# Patient Record
Sex: Male | Born: 1975
Health system: Southern US, Community
[De-identification: ages and names within clinical notes are randomized; demographics above are authoritative.]

## PROBLEM LIST (undated history)

## (undated) DIAGNOSIS — K651 Peritoneal abscess: Secondary | ICD-10-CM

## (undated) DIAGNOSIS — K5090 Crohn's disease, unspecified, without complications: Secondary | ICD-10-CM

## (undated) HISTORY — DX: Peritoneal abscess: K65.1

## (undated) HISTORY — DX: Crohn's disease, unspecified, without complications: K50.90

---

## 2013-12-12 ENCOUNTER — Encounter (HOSPITAL_COMMUNITY): Payer: Self-pay | Admitting: General Surgery

## 2013-12-12 ENCOUNTER — Ambulatory Visit
Admission: RE | Admit: 2013-12-12 | Discharge: 2013-12-12 | Disposition: A | Discharge status: Home / Self Care | Payer: BC Managed Care – PPO | Source: Ambulatory Visit | Attending: Family Medicine | Admitting: Family Medicine

## 2013-12-12 ENCOUNTER — Inpatient Hospital Stay (HOSPITAL_COMMUNITY)
Admission: EM | Admit: 2013-12-12 | Discharge: 2013-12-15 | DRG: 386 | Disposition: A | Discharge status: Home / Self Care | Payer: BC Managed Care – PPO | Attending: Internal Medicine | Admitting: Internal Medicine

## 2013-12-12 ENCOUNTER — Other Ambulatory Visit: Payer: Self-pay | Admitting: Family Medicine

## 2013-12-12 DIAGNOSIS — R1031 Right lower quadrant pain: Secondary | ICD-10-CM

## 2013-12-12 DIAGNOSIS — K50014 Crohn's disease of small intestine with abscess: Principal | ICD-10-CM | POA: Diagnosis present

## 2013-12-12 DIAGNOSIS — R109 Unspecified abdominal pain: Secondary | ICD-10-CM

## 2013-12-12 DIAGNOSIS — N1330 Unspecified hydronephrosis: Secondary | ICD-10-CM | POA: Diagnosis present

## 2013-12-12 DIAGNOSIS — L0291 Cutaneous abscess, unspecified: Secondary | ICD-10-CM

## 2013-12-12 DIAGNOSIS — K651 Peritoneal abscess: Secondary | ICD-10-CM | POA: Diagnosis present

## 2013-12-12 DIAGNOSIS — Z87891 Personal history of nicotine dependence: Secondary | ICD-10-CM | POA: Diagnosis not present

## 2013-12-12 LAB — COMPREHENSIVE METABOLIC PANEL
ALBUMIN: 3.4 g/dL — AB (ref 3.5–5.2)
ALT: 15 U/L (ref 0–53)
AST: 19 U/L (ref 0–37)
Alkaline Phosphatase: 69 U/L (ref 39–117)
Anion gap: 12 (ref 5–15)
BUN: 13 mg/dL (ref 6–23)
CALCIUM: 9.4 mg/dL (ref 8.4–10.5)
CO2: 30 mEq/L (ref 19–32)
Chloride: 98 mEq/L (ref 96–112)
Creatinine, Ser: 0.93 mg/dL (ref 0.50–1.35)
GFR calc Af Amer: >90 mL/min (ref >90)
GFR calc non Af Amer: >90 mL/min (ref >90)
Glucose, Bld: 147 mg/dL — ABNORMAL HIGH (ref 70–99)
Potassium: 4.4 mEq/L (ref 3.7–5.3)
Sodium: 140 mEq/L (ref 137–147)
TOTAL PROTEIN: 7.2 g/dL (ref 6.0–8.3)
Total Bilirubin: 0.4 mg/dL (ref 0.3–1.2)

## 2013-12-12 LAB — CBC
HCT: 40.7 % (ref 39.0–52.0)
Hemoglobin: 13.6 g/dL (ref 13.0–17.0)
MCH: 29.3 pg (ref 26.0–34.0)
MCHC: 33.4 g/dL (ref 30.0–36.0)
MCV: 87.7 fL (ref 78.0–100.0)
Platelets: 270 10*3/uL (ref 150–400)
RBC: 4.64 MIL/uL (ref 4.22–5.81)
RDW: 13 % (ref 11.5–15.5)
WBC: 8.6 10*3/uL (ref 4.0–10.5)

## 2013-12-12 LAB — CBC WITH DIFFERENTIAL/PLATELET
BASOS ABS: 0 10*3/uL (ref 0.0–0.1)
BASOS PCT: 0 % (ref 0–1)
EOS PCT: 1 % (ref 0–5)
Eosinophils Absolute: 0.1 10*3/uL (ref 0.0–0.7)
HCT: 39.5 % (ref 39.0–52.0)
Hemoglobin: 13.1 g/dL (ref 13.0–17.0)
Lymphocytes Relative: 23 % (ref 12–46)
Lymphs Abs: 1.9 10*3/uL (ref 0.7–4.0)
MCH: 29.4 pg (ref 26.0–34.0)
MCHC: 33.2 g/dL (ref 30.0–36.0)
MCV: 88.6 fL (ref 78.0–100.0)
Monocytes Absolute: 0.5 10*3/uL (ref 0.1–1.0)
Monocytes Relative: 6 % (ref 3–12)
NEUTROS ABS: 5.9 10*3/uL (ref 1.7–7.7)
Neutrophils Relative %: 70 % (ref 43–77)
Platelets: 236 10*3/uL (ref 150–400)
RBC: 4.46 MIL/uL (ref 4.22–5.81)
RDW: 12.8 % (ref 11.5–15.5)
WBC: 8.5 10*3/uL (ref 4.0–10.5)

## 2013-12-12 LAB — CREATININE, SERUM
Creatinine, Ser: 0.91 mg/dL (ref 0.50–1.35)
GFR calc non Af Amer: >90 mL/min (ref >90)

## 2013-12-12 LAB — HEPATIC FUNCTION PANEL
ALT: 14 U/L (ref 0–53)
AST: 18 U/L (ref 0–37)
Albumin: 3.4 g/dL — ABNORMAL LOW (ref 3.5–5.2)
Alkaline Phosphatase: 71 U/L (ref 39–117)
Bilirubin, Direct: <0.2 mg/dL (ref 0.0–0.3)
TOTAL PROTEIN: 7.5 g/dL (ref 6.0–8.3)
Total Bilirubin: 0.4 mg/dL (ref 0.3–1.2)

## 2013-12-12 LAB — URINALYSIS, ROUTINE W REFLEX MICROSCOPIC
Bilirubin Urine: NEGATIVE
GLUCOSE, UA: NEGATIVE mg/dL
Hgb urine dipstick: NEGATIVE
Ketones, ur: NEGATIVE mg/dL
LEUKOCYTES UA: NEGATIVE
Nitrite: NEGATIVE
Protein, ur: NEGATIVE mg/dL
SPECIFIC GRAVITY, URINE: 1.029 (ref 1.005–1.030)
UROBILINOGEN UA: 0.2 mg/dL (ref 0.0–1.0)
pH: 7 (ref 5.0–8.0)

## 2013-12-12 LAB — SEDIMENTATION RATE: SED RATE: 56 mm/h — AB (ref 0–16)

## 2013-12-12 LAB — I-STAT CG4 LACTIC ACID, ED: Lactic Acid, Venous: 0.83 mmol/L (ref 0.5–2.2)

## 2013-12-12 LAB — TSH: TSH: 2.2 u[IU]/mL (ref 0.350–4.500)

## 2013-12-12 MED ORDER — ACETAMINOPHEN 650 MG RE SUPP: 650.0000 mg | Freq: Four times a day (QID) | RECTAL | Status: DC | PRN

## 2013-12-12 MED ORDER — ACETAMINOPHEN 325 MG PO TABS: 650.0000 mg | ORAL_TABLET | Freq: Four times a day (QID) | ORAL | Status: DC | PRN

## 2013-12-12 MED ORDER — LEVALBUTEROL HCL 0.63 MG/3ML IN NEBU
0.6300 mg | INHALATION_SOLUTION | Freq: Four times a day (QID) | RESPIRATORY_TRACT | Status: DC | PRN
  Filled 2013-12-12: qty 3

## 2013-12-12 MED ORDER — PIPERACILLIN-TAZOBACTAM 3.375 G IVPB
3.3750 g | Freq: Three times a day (TID) | INTRAVENOUS | Status: DC
  Filled 2013-12-12 (×2): qty 50

## 2013-12-12 MED ORDER — SODIUM CHLORIDE 0.9 % IV SOLN
INTRAVENOUS | Status: DC
  Administered 2013-12-12: 17:00:00 via INTRAVENOUS

## 2013-12-12 MED ORDER — PIPERACILLIN-TAZOBACTAM 3.375 G IVPB 30 MIN: 3.3750 g | Freq: Once | INTRAVENOUS | Status: DC

## 2013-12-12 MED ORDER — ONDANSETRON HCL 4 MG/2ML IJ SOLN
4.0000 mg | Freq: Four times a day (QID) | INTRAMUSCULAR | Status: DC | PRN
Start: 2013-12-12 — End: 2013-12-15

## 2013-12-12 MED ORDER — PIPERACILLIN-TAZOBACTAM 3.375 G IVPB 30 MIN
3.3750 g | Freq: Three times a day (TID) | INTRAVENOUS | Status: DC
  Administered 2013-12-12: 3.375 g via INTRAVENOUS
  Filled 2013-12-12 (×2): qty 50

## 2013-12-12 MED ORDER — PIPERACILLIN-TAZOBACTAM 3.375 G IVPB
3.3750 g | Freq: Three times a day (TID) | INTRAVENOUS | Status: DC
Start: 2013-12-12 — End: 2013-12-15
  Administered 2013-12-12 – 2013-12-15 (×8): 3.375 g via INTRAVENOUS
  Filled 2013-12-12 (×10): qty 50

## 2013-12-12 MED ORDER — ONDANSETRON HCL 4 MG PO TABS: 4.0000 mg | ORAL_TABLET | Freq: Four times a day (QID) | ORAL | Status: DC | PRN

## 2013-12-12 MED ORDER — IOHEXOL 300 MG/ML  SOLN
100.0000 mL | Freq: Once | INTRAMUSCULAR | Status: AC | PRN
  Administered 2013-12-12: 100 mL via INTRAVENOUS

## 2013-12-12 MED ORDER — IOHEXOL 300 MG/ML  SOLN
30.0000 mL | Freq: Once | INTRAMUSCULAR | Status: AC | PRN
  Administered 2013-12-12: 30 mL via ORAL

## 2013-12-12 MED ORDER — ENOXAPARIN SODIUM 40 MG/0.4ML ~~LOC~~ SOLN
40.0000 mg | SUBCUTANEOUS | Status: DC
  Filled 2013-12-12 (×4): qty 0.4

## 2013-12-12 NOTE — ED Provider Notes (Addendum)
CSN: [id number]     Arrival date & time 12/12/13  1502 History   First MD Initiated Contact with Patient 12/12/13 1537     Chief Complaint  Patient presents with   Abdominal Pain     (Consider location/radiation/quality/duration/timing/severity/associated sxs/prior Treatment) The history is provided by the patient and medical records.    This is a 38 y.o. M with no significant PMH presenting to the ED for abdominal pain.  Per patient, for the past 2 weeks he has been having issues with constipation.  Was seen by his PCP, started on stool softener which relieved constipation but continue having severe, right lower abdominal pain.  PCP scheduled him for OP CT abdomen/pelvis earlier this morning which showed edema of terminal ileum as well as likely abscess formation.  Patient was [facility] supposed to [facility], however directed to ED instead for full evaluation.  He endorses subjective fever/chills recently, denies sweats.  No prior abdominal surgeries.  Denies current abdominal pain, states took advil earlier this morning which relieved his pain.  VS stable on arrival.  No past medical history on file. No past surgical history on file. No family history on file. History  Substance Use Topics   Smoking status: Not on file   Smokeless tobacco: Not on file   Alcohol Use: Not on file    Review of Systems  Gastrointestinal:  Positive for abdominal pain.  All other systems reviewed and are negative.     Allergies  Review of patient's allergies indicates no known allergies.  Home Medications   Prior to Admission medications   Not on File   BP 120/60 mmHg  Pulse 67  Temp(Src) 97.8 F (36.6 C) (Oral)  Ht 6\' 1"  (1.854 m)  Wt 170 lb (77.111 kg)  BMI 22.43 kg/m2  SpO2 100%   Physical Exam Vitals and nursing note reviewed.  Constitutional:      General: He is not in acute distress.    Appearance: He is well-developed. He is not diaphoretic.  HENT:     Head:  Normocephalic and atraumatic.     Mouth/Throat:     Mouth: Oropharynx is clear and moist.  Eyes:     Extraocular Movements: EOM normal.     Conjunctiva/sclera: Conjunctivae normal.     Pupils: Pupils are equal, round, and reactive to light.  Cardiovascular:     Rate and Rhythm: Normal rate and regular rhythm.     Heart sounds: Normal heart sounds.  Pulmonary:     Effort: Pulmonary effort is normal. No respiratory distress.     Breath sounds: Normal breath sounds. No wheezing.  Abdominal:     General: Bowel sounds are normal.     Palpations: Abdomen is soft.     Tenderness: There is no abdominal tenderness. There is no guarding.  Musculoskeletal:        General: No edema. Normal range of motion.     Cervical back: Normal range of motion and neck supple.  Skin:    General: Skin is warm and dry.  Neurological:     Mental Status: He is alert and oriented to person, place, and time.  Psychiatric:        Mood and Affect: Mood and affect normal.     ED Course  Procedures (including critical care time) Labs Review Labs Reviewed  COMPREHENSIVE METABOLIC PANEL - Abnormal; Notable for the following:    Glucose, Bld 147 (*)    Albumin 3.4 (*)  All other components within normal limits  HEPATIC FUNCTION PANEL - Abnormal; Notable for the following:    Albumin 3.4 (*)    All other components within normal limits  SEDIMENTATION RATE - Abnormal; Notable for the following:    Sed Rate 56 (*)    All other components within normal limits  CBC WITH DIFFERENTIAL  CBC  CREATININE, SERUM  TSH  URINALYSIS, ROUTINE W REFLEX MICROSCOPIC  COMPREHENSIVE METABOLIC PANEL  CBC  C-REACTIVE PROTEIN  I-STAT CG4 LACTIC ACID, ED    Imaging Review Ct Abdomen Pelvis W Contrast  12/12/2013   CLINICAL DATA:  Right lower quadrant pain for 2 weeks, worsening recently  EXAM: CT ABDOMEN AND PELVIS WITH CONTRAST  TECHNIQUE: Multidetector CT imaging of the abdomen and pelvis was performed using the  standard protocol following bolus administration of intravenous contrast.  CONTRAST:  30mL OMNIPAQUE IOHEXOL 300 MG/ML SOLN, OMNIPAQUE IOHEXOL 300 MG/ML SOLN  COMPARISON:  None.  FINDINGS: The lung bases are clear. The liver enhances with no focal abnormality and no ductal dilatation is seen. No calcified gallstones are noted. The pancreas is normal in size and the pancreatic duct is not dilated. The adrenal glands and spleen are unremarkable. The stomach is moderately fluid distended with no significant abnormality noted. The kidneys enhance with no calculus or mass and no hydronephrosis is seen. The abdominal aorta is normal in caliber. Only a few small retroperitoneal nodes are present.  The appendix is well-visualized being very low in the right pelvis anteriorly. The appendix appears normal filling with air. However, there is abnormality of the terminal ileum with mucosal edema present. Immediately adjacent to the terminal ileum there is an amorphous collection of 3.9 x 3.5 cm most consistent with abscess. The primary consideration is that of inflammatory bowel disease i.e. Crohn's disease with associated abscess. Several prominent adjacent lymph nodes in the right lower quadrant are present as well. No abnormality of the colon is seen. The urinary bladder is unremarkable. The prostate is normal in size. There is a tiny amount of free fluid layering posteriorly within the pelvis. The lumbar vertebrae are in normal alignment.  IMPRESSION: 1. Apparent edema of the terminal ileum with probable adjacent abscess most consistent with Crohn's disease with associated abscess formation. 2. The appendix is well seen and is normal very low in the right bony pelvis.   Electronically Signed   By: Dwyane Dee M.D.   On: 12/12/2013 12:58     EKG Interpretation None      MDM   Final diagnoses:  Intra-abdominal abscess   38 y.o. M with abdominal pain, CT performed earlier with edema of terminal ileum  concerning for possible crohn's disease as well as associated abscess formation.  On exam, afebrile and non-toxic in appearance.  Abdominal exam benign at this time.  Lab work reassuring.  Case discussed with general surgery, do not feel candidate for percutaneous drain at this time, will follow along.  Recommends zosyn for abx choice as well as medical admission and GI consultation.  Case discussed with Dr. Susie Cassette who will admit, agrees with choice of zosyn for abx.  GI will see patient in the morning.  Garlon Hatchet, PA-C 12/13/13 0012  Layla Maw Cassia Fein, DO 12/13/13 1555  Corena Tilson, Layla Maw, DO 11/27/22 [phone]

## 2013-12-12 NOTE — Consult Note (Signed)
Nathaniel West 07-Oct-1975  [id number].   Primary Care MD: Dr. Donnie Coffin    Requesting MD: Dr. Cyril Mourning Ward Chief Complaint/Reason for Consult: abdominal pain and abscess HPI: This is a 38 yo white male who is otherwise healthy who began having abdominal pain in the RLQ 2 weeks ago.  He developed a fever of 100 that day, but none since.  He does admits to some sweats and chills.  He had some RLQ abdominal pain that continued to persist.  He then developed severe constipation.  He took some laxatives that his wife had that only mildly helped.  He developed some bloating and slight decrease in appetite.  He saw his PCP this past Monday and felt his pain was secondary to his constipation still.  He continued to have more pain and went back yesterday.  He felt he may have appendicitis so sent him for a CT scan today.  The scan reveals a 3.5x3.9cm abscess in the RLQ with terminal inflammatory changes, possibly related to crohn's disease.  His appendix is seen and is normal.  He also has a small amount of hydronephrosis on that side secondary to this process as well.  We have been asked to see the patient for further evaluation.  ROS: Please see HPI , otherwise all other systems are negative.  He does get slight worsening pain with urination, but denies dysuria or pneumoturia.  History reviewed. No pertinent family history.  History reviewed. No pertinent past medical history.  History reviewed. No pertinent past surgical history.  Social History:  reports that he has quit smoking. He does not have any smokeless tobacco history on file. He reports that he drinks about 3.0 oz of alcohol per week. He reports that he does not use illicit drugs.  Allergies: No Known Allergies   (Not in a hospital admission)  Blood pressure 124/70, pulse 66, temperature 97.8 F (36.6 C), temperature source Oral, resp. rate 18, height '6\' 1"'  (1.854 m), weight 170 lb (77.111 kg), SpO2 100 %. Physical Exam: General:  pleasant, WD, WN white male who is laying in bed in NAD HEENT: head is normocephalic, atraumatic.  Sclera are noninjected.  PERRL.  Ears and nose without any masses or lesions.  Mouth is pink and moist Heart: regular, rate, and rhythm.  Normal s1,s2. No obvious murmurs, gallops, or rubs noted.  Palpable radial and pedal pulses bilaterally Lungs: CTAB, no wheezes, rhonchi, or rales noted.  Respiratory effort nonlabored Abd: soft, mildly tender in RLQ, ND, +BS, no masses, hernias, or organomegaly MS: all 4 extremities are symmetrical with no cyanosis, clubbing, or edema. Skin: warm and dry with no masses, lesions, or rashes Psych: A&Ox3 with an appropriate affect.    Results for orders placed or performed during the hospital encounter of 12/12/13 (from the past 48 hour(s))  CBC with Differential     Status: None   Collection Time: 12/12/13  3:20 PM  Result Value Ref Range   WBC 8.5 4.0 - 10.5 K/uL   RBC 4.46 4.22 - 5.81 MIL/uL   Hemoglobin 13.1 13.0 - 17.0 g/dL   HCT 39.5 39.0 - 52.0 %   MCV 88.6 78.0 - 100.0 fL   MCH 29.4 26.0 - 34.0 pg   MCHC 33.2 30.0 - 36.0 g/dL   RDW 12.8 11.5 - 15.5 %   Platelets 236 150 - 400 K/uL   Neutrophils Relative % 70 43 - 77 %   Neutro Abs 5.9 1.7 - 7.7 K/uL  Lymphocytes Relative 23 12 - 46 %   Lymphs Abs 1.9 0.7 - 4.0 K/uL   Monocytes Relative 6 3 - 12 %   Monocytes Absolute 0.5 0.1 - 1.0 K/uL   Eosinophils Relative 1 0 - 5 %   Eosinophils Absolute 0.1 0.0 - 0.7 K/uL   Basophils Relative 0 0 - 1 %   Basophils Absolute 0.0 0.0 - 0.1 K/uL  Comprehensive metabolic panel     Status: Abnormal   Collection Time: 12/12/13  3:20 PM  Result Value Ref Range   Sodium 140 137 - 147 mEq/L   Potassium 4.4 3.7 - 5.3 mEq/L   Chloride 98 96 - 112 mEq/L   CO2 30 19 - 32 mEq/L   Glucose, Bld 147 (H) 70 - 99 mg/dL   BUN 13 6 - 23 mg/dL   Creatinine, Ser 0.93 0.50 - 1.35 mg/dL   Calcium 9.4 8.4 - 10.5 mg/dL   Total Protein 7.2 6.0 - 8.3 g/dL   Albumin 3.4 (L)  3.5 - 5.2 g/dL   AST 19 0 - 37 U/L   ALT 15 0 - 53 U/L   Alkaline Phosphatase 69 39 - 117 U/L   Total Bilirubin 0.4 0.3 - 1.2 mg/dL   GFR calc non Af Amer >90 >90 mL/min   GFR calc Af Amer >90 >90 mL/min    Comment: (NOTE) The eGFR has been calculated using the CKD EPI equation. This calculation has not been validated in all clinical situations. eGFR's persistently <90 mL/min signify possible Chronic Kidney Disease.    Anion gap 12 5 - 15  I-Stat CG4 Lactic Acid, ED     Status: None   Collection Time: 12/12/13  3:46 PM  Result Value Ref Range   Lactic Acid, Venous 0.83 0.5 - 2.2 mmol/L   Ct Abdomen Pelvis W Contrast  12/12/2013   CLINICAL DATA:  Right lower quadrant pain for 2 weeks, worsening recently  EXAM: CT ABDOMEN AND PELVIS WITH CONTRAST  TECHNIQUE: Multidetector CT imaging of the abdomen and pelvis was performed using the standard protocol following bolus administration of intravenous contrast.  CONTRAST:  60m OMNIPAQUE IOHEXOL 300 MG/ML SOLN, 1029mOMNIPAQUE IOHEXOL 300 MG/ML SOLN  COMPARISON:  None.  FINDINGS: The lung bases are clear. The liver enhances with no focal abnormality and no ductal dilatation is seen. No calcified gallstones are noted. The pancreas is normal in size and the pancreatic duct is not dilated. The adrenal glands and spleen are unremarkable. The stomach is moderately fluid distended with no significant abnormality noted. The kidneys enhance with no calculus or mass and no hydronephrosis is seen. The abdominal aorta is normal in caliber. Only a few small retroperitoneal nodes are present.  The appendix is well-visualized being very low in the right pelvis anteriorly. The appendix appears normal filling with air. However, there is abnormality of the terminal ileum with mucosal edema present. Immediately adjacent to the terminal ileum there is an amorphous collection of 3.9 x 3.5 cm most consistent with abscess. The primary consideration is that of inflammatory  bowel disease i.e. Crohn's disease with associated abscess. Several prominent adjacent lymph nodes in the right lower quadrant are present as well. No abnormality of the colon is seen. The urinary bladder is unremarkable. The prostate is normal in size. There is a tiny amount of free fluid layering posteriorly within the pelvis. The lumbar vertebrae are in normal alignment.  IMPRESSION: 1. Apparent edema of the terminal ileum with probable adjacent abscess most  consistent with Crohn's disease with associated abscess formation. 2. The appendix is well seen and is normal very low in the right bony pelvis.   Electronically Signed   By: Ivar Drape M.D.   On: 12/12/2013 12:58       Assessment/Plan 1. RLQ intra-abdominal abscess with thickening of the terminal ileum  Plan: 1.  We recommend admission by the medicine service with a GI consultation due to a concern on CT scan for crohn's disease.  The patient has no history or family history of crohn's disease or IBD.  I have d/w Dr. Pascal Lux with IR.  There is no window currently to try and percutaneously drain this abscess.  He will need to be on IV abx therapy, such as zosyn, to try and resolve the abscess without surgical intervention hopefully.  He will likely require repeat CT scan in 4-7 days to re-evaluate this abscess.  Will defer to GI if they feel the patient needs steroid or some other treatment based off of his CT scan.  Would leave him NPO x ice chips for now.  We will follow along.  Naheem Mosco E 12/12/2013, 4:39 PM Pager: [phone]

## 2013-12-12 NOTE — ED Notes (Signed)
Barnetta ChapelKelly Osborne PA wants EDP to evaluate pt.

## 2013-12-12 NOTE — Consult Note (Signed)
ANTIBIOTIC CONSULT NOTE - INITIAL  Pharmacy Consult for Zosyn Indication: intra-abdominal infection  No Known Allergies  Patient Measurements: Height: 6\' 1"  (185.4 cm) Weight: 170 lb (77.111 kg) IBW/kg (Calculated) : 79.9  Vital Signs: Temp: 97.8 F (36.6 C) (11/19 1511) Temp Source: Oral (11/19 1511) BP: 124/70 mmHg (11/19 1553) Pulse Rate: 66 (11/19 1553) Intake/Output from previous day:   Intake/Output from this shift:    Labs:  Recent Labs  12/12/13 1520  WBC 8.5  HGB 13.1  PLT 236  CREATININE 0.93   Estimated Creatinine Clearance: 118.6 mL/min (by C-G formula based on Cr of 0.93).  Microbiology: No results found for this or any previous visit (from the past 720 hour(s)).  Medical History: History reviewed. No pertinent past medical history.  Assessment: 38yom presents to the ED with abdominal pain. CT scan shows intra-abdominal abscess. He will begin zosyn. Renal function wnl.  Goal of Therapy:  Appropriate zosyn dosing  Plan:  1) Zosyn 3.375g IV q8 (4 hour infusion) 2) Follow renal function, LOT  Nathaniel West, Nathaniel West 12/12/2013,4:41 PM

## 2013-12-12 NOTE — H&P (Addendum)
[facility] History and Physical  Nathaniel West WGN:[id number] DOB: Nov 19, 1975 DOA: 12/12/2013  Referring physician:  PCP: No primary care provider on file.   Chief Complaint: Abdominal pain  HPI:  38 yo white male who is otherwise healthy who began having abdominal pain in the RLQ 2 weeks ago.  He developed a fever a low-grade fever when his symptoms started. 2 weeks ago.  Despite resolution of his constipation the patient continued to have  RLQ abdominal pain .    He developed some bloating and slight decrease in appetite.  He saw his PCP this past Monday and felt his pain was secondary to his constipation still.  He continued to have more pain and went back yesterday.  He felt he may have appendicitis so sent him for a CT scan today.  The scan reveals a 3.5x3.9cm abscess in the RLQ with terminal inflammatory changes. Denies any recent weight loss. He denies any hematochezia or melena. His appendix is seen and is normal.  He also has a small amount of hydronephrosis on that side secondary to this process as well.  General surgery was consulted for intra-abdominal abscess.  Review of Systems: negative for the following  Constitutional: Denies fever, chills, diaphoresis, appetite change and fatigue.  HEENT: Denies photophobia, eye pain, redness, hearing loss, ear pain, congestion, sore throat, rhinorrhea, sneezing, mouth sores, trouble swallowing, neck pain, neck stiffness and tinnitus.  Respiratory: Denies SOB, DOE, cough, chest tightness, and wheezing.  Cardiovascular: Denies chest pain, palpitations and leg swelling.  Gastrointestinal: Positive for abdominal pain, positive for constipation.  Genitourinary: Denies dysuria, urgency, frequency, hematuria, flank pain and difficulty urinating.  Musculoskeletal: Denies myalgias, back pain, joint swelling, arthralgias and gait problem.  Skin: Denies pallor, rash and wound.  Neurological: Denies dizziness, seizures, syncope, weakness,  light-headedness, numbness and headaches.  Hematological: Denies adenopathy. Easy bruising, personal or family bleeding history  Psychiatric/Behavioral: Denies suicidal ideation, mood changes, confusion, nervousness, sleep disturbance and agitation       History reviewed. No pertinent past medical history.   History reviewed. No pertinent past surgical history.    Social History:  reports that he has quit smoking. He does not have any smokeless tobacco history on file. He reports that he drinks about 3.0 oz of alcohol per week. He reports that he does not use illicit drugs.    No Known Allergies  History reviewed. No pertinent family history.   Prior to Admission medications   Medication Sig Start Date End Date Taking? Authorizing Provider  hyoscyamine (LEVSIN, ANASPAZ) 0.125 MG tablet Take 0.125 mg by mouth as needed for bladder spasms.  12/11/13  Yes Historical Provider, MD  ibuprofen (ADVIL,MOTRIN) 200 MG tablet Take 400 mg by mouth every 6 (six) hours as needed for moderate pain.   Yes Historical Provider, MD     Physical Exam: Filed Vitals:   12/12/13 1600 12/12/13 1615 12/12/13 1630 12/12/13 1645  BP: 121/62 124/79 131/78 120/75  Pulse: 64 64 62 65  Temp:      TempSrc:      Resp:      Height:      Weight:      SpO2: 100% 100% 100% 100%     Constitutional: Vital signs reviewed. Patient is a well-developed and well-nourished in no acute distress and cooperative with exam. Alert and oriented x3.  Head: Normocephalic and atraumatic  Ear: TM normal bilaterally  Mouth: no erythema or exudates, MMM  Eyes: PERRL, EOMI, conjunctivae normal, No scleral icterus.  Neck: Supple, Trachea midline normal ROM, No JVD, mass, thyromegaly, or carotid bruit present.  Cardiovascular: RRR, S1 normal, S2 normal, no MRG, pulses symmetric and intact bilaterally  Pulmonary/Chest: CTAB, no wheezes, rales, or rhonchi  Abdominal: Soft. Non-tender, non-distended, bowel sounds are normal, no  masses, organomegaly, or guarding present.  GU: no CVA tenderness Musculoskeletal: No joint deformities, erythema, or stiffness, ROM full and no nontender Ext: no edema and no cyanosis, pulses palpable bilaterally (DP and PT)  Hematology: no cervical, inginal, or axillary adenopathy.  Neurological: A&O x3, Strenght is normal and symmetric bilaterally, cranial nerve II-XII are grossly intact, no focal motor deficit, sensory intact to light touch bilaterally.  Skin: Warm, dry and intact. No rash, cyanosis, or clubbing.  Psychiatric: Normal mood and affect. speech and behavior is normal. Judgment and thought content normal. Cognition and memory are normal.       Labs on Admission:    Basic Metabolic Panel:  Recent Labs Lab 12/12/13 1520  NA 140  K 4.4  CL 98  CO2 30  GLUCOSE 147*  BUN 13  CREATININE 0.93  CALCIUM 9.4   Liver Function Tests:  Recent Labs Lab 12/12/13 1520  AST 19  ALT 15  ALKPHOS 69  BILITOT 0.4  PROT 7.2  ALBUMIN 3.4*   No results for input(s): LIPASE, AMYLASE in the last 168 hours. No results for input(s): AMMONIA in the last 168 hours. CBC:  Recent Labs Lab 12/12/13 1520  WBC 8.5  NEUTROABS 5.9  HGB 13.1  HCT 39.5  MCV 88.6  PLT 236   Cardiac Enzymes: No results for input(s): CKTOTAL, CKMB, CKMBINDEX, TROPONINI in the last 168 hours.  BNP (last 3 results) No results for input(s): PROBNP in the last 8760 hours.    CBG: No results for input(s): GLUCAP in the last 168 hours.  Radiological Exams on Admission: Ct Abdomen Pelvis W Contrast  12/12/2013   CLINICAL DATA:  Right lower quadrant pain for 2 weeks, worsening recently  EXAM: CT ABDOMEN AND PELVIS WITH CONTRAST  TECHNIQUE: Multidetector CT imaging of the abdomen and pelvis was performed using the standard protocol following bolus administration of intravenous contrast.  CONTRAST:  30mL OMNIPAQUE IOHEXOL 300 MG/ML SOLN, OMNIPAQUE IOHEXOL 300 MG/ML SOLN  COMPARISON:  None.   FINDINGS: The lung bases are clear. The liver enhances with no focal abnormality and no ductal dilatation is seen. No calcified gallstones are noted. The pancreas is normal in size and the pancreatic duct is not dilated. The adrenal glands and spleen are unremarkable. The stomach is moderately fluid distended with no significant abnormality noted. The kidneys enhance with no calculus or mass and no hydronephrosis is seen. The abdominal aorta is normal in caliber. Only a few small retroperitoneal nodes are present.  The appendix is well-visualized being very low in the right pelvis anteriorly. The appendix appears normal filling with air. However, there is abnormality of the terminal ileum with mucosal edema present. Immediately adjacent to the terminal ileum there is an amorphous collection of 3.9 x 3.5 cm most consistent with abscess. The primary consideration is that of inflammatory bowel disease i.e. Crohn's disease with associated abscess. Several prominent adjacent lymph nodes in the right lower quadrant are present as well. No abnormality of the colon is seen. The urinary bladder is unremarkable. The prostate is normal in size. There is a tiny amount of free fluid layering posteriorly within the pelvis. The lumbar vertebrae are in normal alignment.  IMPRESSION: 1. Apparent edema of  the terminal ileum with probable adjacent abscess most consistent with Crohn's disease with associated abscess formation. 2. The appendix is well seen and is normal very low in the right bony pelvis.   Electronically Signed   By: Dwyane Dee M.D.   On: 12/12/2013 12:58    EKG: Independently reviewed.    Assessment/Plan Active Problems:   Intra-abdominal abscess   Intra-abdominal abscess Admit to medicine for IV antibiotics Started the patient on Zosyn Nontoxic in appearance General surgery felt that he is not a candidate for percutaneous drainage I have requested [facility] to see him in the  morning Patient will likely need repeat CT scan in 4-7 days to reevaluate his abscess Nothing by mouth for now Will check ESR and CRP No indication for IV steroids, given abscess  Addendum 12/08/2022:  In my capacity as a Wellsite geologist for [facility], I was informed by the medical records department that the patient has been informed by his Gastroenterologist that he does NOT have Crohn's disease. He has therefore requested that his medical records be amended to include this updated information, to avoid further confusion regarding his medical history or care. As the original Thereasa Parkin of this note is no longer employed by [facility], I have been asked to make the necessary amendments. I agree this is appropriate and accurate, and have thus removed this erroneous diagnosis from this note.   Lonia Blood, MD 02/06/2022 [facility]   Code Status:   full Family Communication: bedside Disposition Plan: admit   Time spent: 70 mins   [facility] Pager [phone]  If 7PM-7AM, please contact night-coverage [email or web address] Password [facility] 12/12/2013, 5:37 PM

## 2013-12-12 NOTE — ED Notes (Signed)
Hx of 2 weeks of "constipation". Had CT per PCP today and sent here for [facility] consult. Barnetta ChapelKelly Osborne paged.

## 2013-12-13 DIAGNOSIS — K651 Peritoneal abscess: Secondary | ICD-10-CM

## 2013-12-13 DIAGNOSIS — K50114 Crohn's disease of large intestine with abscess: Secondary | ICD-10-CM

## 2013-12-13 LAB — CBC
HCT: 41.1 % (ref 39.0–52.0)
HEMOGLOBIN: 13.5 g/dL (ref 13.0–17.0)
MCH: 29.8 pg (ref 26.0–34.0)
MCHC: 32.8 g/dL (ref 30.0–36.0)
MCV: 90.7 fL (ref 78.0–100.0)
Platelets: 231 10*3/uL (ref 150–400)
RBC: 4.53 MIL/uL (ref 4.22–5.81)
RDW: 12.9 % (ref 11.5–15.5)
WBC: 10.6 10*3/uL — ABNORMAL HIGH (ref 4.0–10.5)

## 2013-12-13 LAB — COMPREHENSIVE METABOLIC PANEL
ALT: 12 U/L (ref 0–53)
AST: 15 U/L (ref 0–37)
Albumin: 3.1 g/dL — ABNORMAL LOW (ref 3.5–5.2)
Alkaline Phosphatase: 66 U/L (ref 39–117)
Anion gap: 12 (ref 5–15)
BUN: 14 mg/dL (ref 6–23)
CO2: 28 mEq/L (ref 19–32)
Calcium: 9.1 mg/dL (ref 8.4–10.5)
Chloride: 103 mEq/L (ref 96–112)
Creatinine, Ser: 1.15 mg/dL (ref 0.50–1.35)
GFR calc Af Amer: >90 mL/min (ref >90)
GFR calc non Af Amer: 80 mL/min — ABNORMAL LOW (ref >90)
GLUCOSE: 78 mg/dL (ref 70–99)
Potassium: 4.2 mEq/L (ref 3.7–5.3)
SODIUM: 143 meq/L (ref 137–147)
TOTAL PROTEIN: 6.6 g/dL (ref 6.0–8.3)
Total Bilirubin: 0.5 mg/dL (ref 0.3–1.2)

## 2013-12-13 LAB — C-REACTIVE PROTEIN: CRP: 7.4 mg/dL — ABNORMAL HIGH (ref <0.60)

## 2013-12-13 LAB — VITAMIN B12: Vitamin B-12: 676 pg/mL (ref 211–911)

## 2013-12-13 MED ORDER — SODIUM CHLORIDE 0.9 % IV SOLN
INTRAVENOUS | Status: DC
  Administered 2013-12-14: 07:00:00 via INTRAVENOUS

## 2013-12-13 NOTE — Progress Notes (Signed)
Patient ID: Nathaniel CalamityRobert T West, male   DOB: 05/25/1975, 38 y.o.   MRN: [id number]    Subjective: Pt continues to feel well.  Mild RLQ pain.  Up shaving.    Objective: Vital signs in last 24 hours: Temp:  [97.8 F (36.6 C)-98.7 F (37.1 C)] 98.7 F (37.1 C) (11/20 10270633) Pulse Rate:  [59-67] 66 (11/20 0633) Resp:  [18-22] 20 (11/20 0633) BP: (103-131)/(58-79) 103/58 mmHg (11/20 0633) SpO2:  [97 %-100 %] 98 % (11/20 25360633) Weight:  [170 lb (77.111 kg)] 170 lb (77.111 kg) (11/19 1908) Last BM Date: 12/12/13  Intake/Output from previous day: 11/19 0701 - 11/20 0700 In: 1800 [I.V.:1700; IV Piggyback:100] Out: -  Intake/Output this shift: Total I/O In: 454.2 [I.V.:454.2] Out: -   PE: Abd: soft, mild RLQ tenderness, +BS, ND  Lab Results:   Recent Labs  12/12/13 1725 12/13/13 0527  WBC 8.6 10.6*  HGB 13.6 13.5  HCT 40.7 41.1  PLT 270 231   BMET  Recent Labs  12/12/13 1520 12/12/13 1725 12/13/13 0527  NA 140  --  143  K 4.4  --  4.2  CL 98  --  103  CO2 30  --  28  GLUCOSE 147*  --  78  BUN 13  --  14  CREATININE 0.93 0.91 1.15  CALCIUM 9.4  --  9.1   PT/INR No results for input(s): LABPROT, INR in the last 72 hours. CMP     Component Value Date/Time   NA 143 12/13/2013 0527   K 4.2 12/13/2013 0527   CL 103 12/13/2013 0527   CO2 28 12/13/2013 0527   GLUCOSE 78 12/13/2013 0527   BUN 14 12/13/2013 0527   CREATININE 1.15 12/13/2013 0527   CALCIUM 9.1 12/13/2013 0527   PROT 6.6 12/13/2013 0527   ALBUMIN 3.1* 12/13/2013 0527   AST 15 12/13/2013 0527   ALT 12 12/13/2013 0527   ALKPHOS 66 12/13/2013 0527   BILITOT 0.5 12/13/2013 0527   GFRNONAA 80* 12/13/2013 0527   GFRAA >90 12/13/2013 0527   Lipase  No results found for: LIPASE     Studies/Results: Ct Abdomen Pelvis W Contrast  12/12/2013   CLINICAL DATA:  Right lower quadrant pain for 2 weeks, worsening recently  EXAM: CT ABDOMEN AND PELVIS WITH CONTRAST  TECHNIQUE: Multidetector CT imaging of the  abdomen and pelvis was performed using the standard protocol following bolus administration of intravenous contrast.  CONTRAST:  30mL OMNIPAQUE IOHEXOL 300 MG/ML SOLN, 100mL OMNIPAQUE IOHEXOL 300 MG/ML SOLN  COMPARISON:  None.  FINDINGS: The lung bases are clear. The liver enhances with no focal abnormality and no ductal dilatation is seen. No calcified gallstones are noted. The pancreas is normal in size and the pancreatic duct is not dilated. The adrenal glands and spleen are unremarkable. The stomach is moderately fluid distended with no significant abnormality noted. The kidneys enhance with no calculus or mass and no hydronephrosis is seen. The abdominal aorta is normal in caliber. Only a few small retroperitoneal nodes are present.  The appendix is well-visualized being very low in the right pelvis anteriorly. The appendix appears normal filling with air. However, there is abnormality of the terminal ileum with mucosal edema present. Immediately adjacent to the terminal ileum there is an amorphous collection of 3.9 x 3.5 cm most consistent with abscess. The primary consideration is that of inflammatory bowel disease i.e. Crohn's disease with associated abscess. Several prominent adjacent lymph nodes in the right lower quadrant are present  as well. No abnormality of the colon is seen. The urinary bladder is unremarkable. The prostate is normal in size. There is a tiny amount of free fluid layering posteriorly within the pelvis. The lumbar vertebrae are in normal alignment.  IMPRESSION: 1. Apparent edema of the terminal ileum with probable adjacent abscess most consistent with Crohn's disease with associated abscess formation. 2. The appendix is well seen and is normal very low in the right bony pelvis.   Electronically Signed   By: Dwyane DeePaul  Barry M.D.   On: 12/12/2013 12:58    Anti-infectives: Anti-infectives    Start     Dose/Rate Route Frequency Ordered Stop   12/13/13 0100  piperacillin-tazobactam (ZOSYN)  IVPB 3.375 g  Status:  Discontinued     3.375 g12.5 mL/hr over 240 Minutes Intravenous 3 times per day 12/12/13 1641 12/12/13 2158   12/12/13 2200  piperacillin-tazobactam (ZOSYN) IVPB 3.375 g     3.375 g12.5 mL/hr over 240 Minutes Intravenous 3 times per day 12/12/13 1848     12/12/13 1645  piperacillin-tazobactam (ZOSYN) IVPB 3.375 g  Status:  Discontinued     3.375 g100 mL/hr over 30 Minutes Intravenous  Once 12/12/13 1641 12/12/13 1643   12/12/13 1645  piperacillin-tazobactam (ZOSYN) IVPB 3.375 g  Status:  Discontinued     3.375 g100 mL/hr over 30 Minutes Intravenous 3 times per day 12/12/13 1643 12/12/13 1847       Assessment/Plan  1. RLQ intra-abdominal abscess 2. Inflammation of terminal ileum, ? Crohn's disease  Plan: 1. Will give clear liquids today 2. Cont IV zosyn.  IV abx is best option right now to try and treat this abscess hopefully without requiring an operation.  Given the possibility of crohn's disease, an operation would put him at higher risks for complications, like fistulae.  He will need a repeat CT scan, timing is dependent.  IR has said a repeat CT scan within the next day or two to see if they have a window could be done to see if the abscess is drainable.  The other option is to just wait the typical 5-7 days and then repeat a CT scan to see if they abscess is improved and he can be converted to oral abx therapy and followed as an outpatient.  Obviously, if he develops an increasing WBC, fevers, or worsening pain he may require a scan sooner than anticipated vs an operation.  This all was discussed with the patient and his wife.  They understand. 3. GI will plan on c-scope possibly in a couple of weeks to hopefully intubate the TI and get a biopsy to determine pathology.   LOS: 1 day    Lorien Shingler E 12/13/2013, 11:31 AM Pager: [phone]

## 2013-12-13 NOTE — Care Management Note (Unsigned)
    Page 1 of 1   12/13/2013     5:18:01 PM CARE MANAGEMENT NOTE 12/13/2013  Patient:  Nathaniel West,Nathaniel West   Account Number:  [id number]  Date Initiated:  12/13/2013  Documentation initiated by:  McNally,Kristin  Subjective/Objective Assessment:   pt admit with crohn dx     Action/Plan:   pt from home   Anticipated DC Date:  12/16/2013   Anticipated DC Plan:  HOME/SELF CARE         Choice offered to / List presented to:             Status of service:  In process, will continue to follow Medicare Important Message given?   (If response is "NO", the following Medicare IM given date fields will be blank) Date Medicare IM given:   Medicare IM given by:   Date Additional Medicare IM given:   Additional Medicare IM given by:    Discharge Disposition:    Per UR Regulation:    If discussed at Long Length of Stay Meetings, dates discussed:    Comments:  12/13/13 kristin mcnally rn pt admit with crohns dz. pt from home. At this time anticipated dc home with no needs.

## 2013-12-13 NOTE — Care Management (Signed)
Utilization Review Completed.  

## 2013-12-13 NOTE — Progress Notes (Addendum)
Patient Demographics  Nathaniel West, is a 38 y.o. male, DOB - 15-Jun-1975, KVQ:[id number]  Admit date - 12/12/2013   Admitting Physician Richarda Overlie, MD  Outpatient Primary MD for the patient is No primary care provider on file.  LOS - 1   Chief Complaint  Patient presents with   Abdominal Pain        Subjective:   Nathaniel West today has, No headache, No chest pain, mild RLQ abdominal pain - No Nausea, No new weakness tingling or numbness, No Cough - SOB.   Assessment & Plan    1. Right lower quadrant pain with terminal ileum abscess suspicious for possible IBD with abscess formation. Currently being seen by general surgery, GI has been called, continue bowel rest, IV fluids,  IV Zosyn, no clear windows for drain placement. Repeat CT scan likely in few days. We will defer further management to GI and surgery. Check B-12 level.     Code Status: full  Family Communication: none present  Disposition Plan: Likely home   Procedures CT abd-Pelvis   Consults  GI, CCS   Medications  Scheduled Meds:  enoxaparin (LOVENOX) injection  40 mg Subcutaneous Q24H   piperacillin-tazobactam (ZOSYN)  IV  3.375 g Intravenous 3 times per day   Continuous Infusions:  sodium chloride     PRN Meds:.acetaminophen **OR** [DISCONTINUED] acetaminophen, levalbuterol, [DISCONTINUED] ondansetron **OR** ondansetron (ZOFRAN) IV  DVT Prophylaxis  Lovenox    Lab Results  Component Value Date   PLT 231 12/13/2013    Antibiotics    Anti-infectives    Start     Dose/Rate Route Frequency Ordered Stop   12/13/13 0100  piperacillin-tazobactam (ZOSYN) IVPB 3.375 g  Status:  Discontinued     3.375 g12.5 mL/hr over 240 Minutes Intravenous 3 times per day 12/12/13 1641 12/12/13 2158   12/12/13 2200   piperacillin-tazobactam (ZOSYN) IVPB 3.375 g     3.375 g12.5 mL/hr over 240 Minutes Intravenous 3 times per day 12/12/13 1848     12/12/13 1645  piperacillin-tazobactam (ZOSYN) IVPB 3.375 g  Status:  Discontinued     3.375 g100 mL/hr over 30 Minutes Intravenous  Once 12/12/13 1641 12/12/13 1643   12/12/13 1645  piperacillin-tazobactam (ZOSYN) IVPB 3.375 g  Status:  Discontinued     3.375 g100 mL/hr over 30 Minutes Intravenous 3 times per day 12/12/13 1643 12/12/13 1847          Objective:   Filed Vitals:   12/12/13 1815 12/12/13 1908 12/12/13 2316 12/13/13 0633  BP: 121/66 123/73 104/67 103/58  Pulse: 62 61 59 66  Temp:  98.5 F (36.9 C) 98.5 F (36.9 C) 98.7 F (37.1 C)  TempSrc:  Oral Oral Oral  Resp:  18 22 20   Height:  6\' 1"  (1.854 m)    Weight:  77.111 kg (170 lb)    SpO2: 100% 100% 97% 98%    Wt Readings from Last 3 Encounters:  12/12/13 77.111 kg (170 lb)     Intake/Output Summary (Last 24 hours) at 12/13/13 1044 Last data filed at 12/13/13 1038  Gross per 24 hour  Intake 2254.17 ml  Output      0 ml  Net 2254.17 ml     Physical Exam  Awake  Alert, Oriented X 3, No new F.N deficits, Normal affect [place].AT,PERRAL Supple Neck,No JVD, No cervical lymphadenopathy appriciated.  Symmetrical Chest wall movement, Good air movement bilaterally, CTAB RRR,No Gallops,Rubs or new Murmurs, No Parasternal Heave +ve B.Sounds, Abd Soft, +ve RLQ tenderness, No organomegaly appriciated, No rebound - guarding or rigidity. No Cyanosis, Clubbing or edema, No new Rash or bruise      Data Review   Micro Results No results found for this or any previous visit (from the past 240 hour(s)).  Radiology Reports Ct Abdomen Pelvis W Contrast  12/12/2013   CLINICAL DATA:  Right lower quadrant pain for 2 weeks, worsening recently  EXAM: CT ABDOMEN AND PELVIS WITH CONTRAST  TECHNIQUE: Multidetector CT imaging of the abdomen and pelvis was performed using the standard protocol following  bolus administration of intravenous contrast.  CONTRAST:  30mL OMNIPAQUE IOHEXOL 300 MG/ML SOLN, OMNIPAQUE IOHEXOL 300 MG/ML SOLN  COMPARISON:  None.  FINDINGS: The lung bases are clear. The liver enhances with no focal abnormality and no ductal dilatation is seen. No calcified gallstones are noted. The pancreas is normal in size and the pancreatic duct is not dilated. The adrenal glands and spleen are unremarkable. The stomach is moderately fluid distended with no significant abnormality noted. The kidneys enhance with no calculus or mass and no hydronephrosis is seen. The abdominal aorta is normal in caliber. Only a few small retroperitoneal nodes are present.  The appendix is well-visualized being very low in the right pelvis anteriorly. The appendix appears normal filling with air. However, there is abnormality of the terminal ileum with mucosal edema present. Immediately adjacent to the terminal ileum there is an amorphous collection of 3.9 x 3.5 cm most consistent with abscess. The primary consideration is that of inflammatory bowel disease i.e. Crohn's disease with associated abscess. Several prominent adjacent lymph nodes in the right lower quadrant are present as well. No abnormality of the colon is seen. The urinary bladder is unremarkable. The prostate is normal in size. There is a tiny amount of free fluid layering posteriorly within the pelvis. The lumbar vertebrae are in normal alignment.  IMPRESSION: 1. Apparent edema of the terminal ileum with probable adjacent abscess most consistent with Crohn's disease with associated abscess formation. 2. The appendix is well seen and is normal very low in the right bony pelvis.   Electronically Signed   By: Dwyane Dee M.D.   On: 12/12/2013 12:58     CBC  Recent Labs Lab 12/12/13 1520 12/12/13 1725 12/13/13 0527  WBC 8.5 8.6 10.6*  HGB 13.1 13.6 13.5  HCT 39.5 40.7 41.1  PLT 236 270 231  MCV 88.6 87.7 90.7  MCH 29.4 29.3 29.8  MCHC 33.2  33.4 32.8  RDW 12.8 13.0 12.9  LYMPHSABS 1.9  --   --   MONOABS 0.5  --   --   EOSABS 0.1  --   --   BASOSABS 0.0  --   --     Chemistries   Recent Labs Lab 12/12/13 1520 12/12/13 1725 12/13/13 0527  NA 140  --  143  K 4.4  --  4.2  CL 98  --  103  CO2 30  --  28  GLUCOSE 147*  --  78  BUN 13  --  14  CREATININE 0.93 0.91 1.15  CALCIUM 9.4  --  9.1  AST 19 18 15   ALT 15 14 12   ALKPHOS 69 71 66  BILITOT 0.4 0.4 0.5   ------------------------------------------------------------------------------------------------------------------  estimated creatinine clearance is 95.9 mL/min (by C-G formula based on Cr of 1.15). ------------------------------------------------------------------------------------------------------------------ No results for input(s): HGBA1C in the last 72 hours. ------------------------------------------------------------------------------------------------------------------ No results for input(s): CHOL, HDL, LDLCALC, TRIG, CHOLHDL, LDLDIRECT in the last 72 hours. ------------------------------------------------------------------------------------------------------------------  Recent Labs  12/12/13 1725  TSH 2.200   ------------------------------------------------------------------------------------------------------------------ No results for input(s): VITAMINB12, FOLATE, FERRITIN, TIBC, IRON, RETICCTPCT in the last 72 hours.  Coagulation profile No results for input(s): INR, PROTIME in the last 168 hours.  No results for input(s): DDIMER in the last 72 hours.  Cardiac Enzymes No results for input(s): CKMB, TROPONINI, MYOGLOBIN in the last 168 hours.  Invalid input(s): CK ------------------------------------------------------------------------------------------------------------------ Invalid input(s): POCBNP     Time Spent in minutes  35   Jovannie Ulibarri K M.D on 12/13/2013 at 10:44 AM  Between 7am to 7pm - Pager -  [phone]  After 7pm go to [email or web address] - password [email or web address]  And look for the night coverage person covering for me after hours  [facility] Office  [phone]

## 2013-12-13 NOTE — Plan of Care (Signed)
Problem: Phase I Progression Outcomes Goal: Pain controlled with appropriate interventions Outcome: Progressing     

## 2013-12-13 NOTE — Consult Note (Signed)
Consultation  Referring Provider:  [facility]   Primary Care Physician:  No primary care provider on file. Primary Gastroenterologist: none        Reason for Consultation:  Abnormal CTscan            HPI:   Nathaniel West is a 38 y.o. male with no significant PMH who presented to ED with abdominal pain. Two weeks ago after dining out patient developed lower abdominal pain and low grade temp. Patient though he was just constipated. He began laxatives and pain improved but didn't resolve. He feels bloated despite BMs returning to almost normal for him. Fever was present only one day. Patient came to ED. CTscan reveals apparent edema of TI with probable abscess. Appendix normal  No chronic GI complaints. Specifically no history of abdominal pain. BMs are regular. No blood in stool Appetite is good, no weight loss. No skin rashes He has a few achy joints but lifts weights. No FMH of IBD (except in very, very distant relative).   PSH: none FMH: No IBD or chronic GI illnesses   History  Substance Use Topics  . Smoking status: Former Games developer  . Smokeless tobacco: Not on file  . Alcohol Use: 3.0 oz/week    5 Cans of beer per week    Prior to Admission medications   Medication Sig Start Date End Date Taking? Authorizing Provider  hyoscyamine (LEVSIN, ANASPAZ) 0.125 MG tablet Take 0.125 mg by mouth as needed for bladder spasms.  12/11/13  Yes Historical Provider, MD  ibuprofen (ADVIL,MOTRIN) 200 MG tablet Take 400 mg by mouth every 6 (six) hours as needed for moderate pain.   Yes Historical Provider, MD    Current Facility-Administered Medications  Medication Dose Route Frequency Provider Last Rate Last Dose  . 0.9 %  sodium chloride infusion   Intravenous Continuous Leroy Sea, MD 100 mL/hr at 12/13/13 1100 100 mL/hr at 12/13/13 1100  . acetaminophen (TYLENOL) tablet 650 mg  650 mg Oral Q6H PRN Richarda Overlie, MD      . enoxaparin (LOVENOX) injection 40 mg  40 mg Subcutaneous  Q24H Richarda Overlie, MD      . levalbuterol (XOPENEX) nebulizer solution 0.63 mg  0.63 mg Nebulization Q6H PRN Richarda Overlie, MD      . ondansetron (ZOFRAN) injection 4 mg  4 mg Intravenous Q6H PRN Richarda Overlie, MD      . piperacillin-tazobactam (ZOSYN) IVPB 3.375 g  3.375 g Intravenous 3 times per day Richarda Overlie, MD   3.375 g at 12/13/13 0514    Allergies as of 12/12/2013  . (No Known Allergies)   Review of Systems:    All systems reviewed and negative except where noted in HPI.   Physical Exam:  Vital signs in last 24 hours: Temp:  [97.8 F (36.6 C)-98.7 F (37.1 C)] 98.7 F (37.1 C) (11/20 1610) Pulse Rate:  [59-67] 66 (11/20 0633) Resp:  [18-22] 20 (11/20 9604) BP: (103-131)/(58-79) 103/58 mmHg (11/20 0633) SpO2:  [97 %-100 %] 98 % (11/20 5409) Weight:  [170 lb (77.111 kg)] 170 lb (77.111 kg) (11/19 1908) Last BM Date: 12/12/13 General:   Pleasant white male in NAD Head:  Normocephalic and atraumatic. Eyes:   No icterus.   Conjunctiva pink. Ears:  Normal auditory acuity. Neck:  Supple; no masses felt Lungs:  Respirations even and unlabored. Lungs clear to auscultation bilaterally.   No wheezes, crackles, or rhonchi.  Heart:  Regular rate and rhythm;  murmur heard. Abdomen:  Soft, nondistended, moderate RLQ tenderness. A few bowel sounds. No appreciable masses or hepatomegaly.  Rectal:  Not performed.  Msk:  Symmetrical without gross deformities.  Extremities:  Without edema. Neurologic:  Alert and  oriented x4;  grossly normal neurologically. Skin:  Intact without significant lesions or rashes. Cervical Nodes:  No significant cervical adenopathy. Psych:  Alert and cooperative. Normal affect.  LAB RESULTS:  Recent Labs  12/12/13 1520 12/12/13 1725 12/13/13 0527  WBC 8.5 8.6 10.6*  HGB 13.1 13.6 13.5  HCT 39.5 40.7 41.1  PLT 236 270 231   BMET  Recent Labs  12/12/13 1520 12/12/13 1725 12/13/13 0527  NA 140  --  143  K 4.4  --  4.2  CL 98  --  103  CO2 30   --  28  GLUCOSE 147*  --  78  BUN 13  --  14  CREATININE 0.93 0.91 1.15  CALCIUM 9.4  --  9.1   LFT  Recent Labs  12/12/13 1725 12/13/13 0527  PROT 7.5 6.6  ALBUMIN 3.4* 3.1*  AST 18 15  ALT 14 12  ALKPHOS 71 66  BILITOT 0.4 0.5  BILIDIR <0.2  --   IBILI NOT CALCULATED  --     STUDIES: Ct Abdomen Pelvis W Contrast  12/12/2013   CLINICAL DATA:  Right lower quadrant pain for 2 weeks, worsening recently  EXAM: CT ABDOMEN AND PELVIS WITH CONTRAST  TECHNIQUE: Multidetector CT imaging of the abdomen and pelvis was performed using the standard protocol following bolus administration of intravenous contrast.  CONTRAST:  30mL OMNIPAQUE IOHEXOL 300 MG/ML SOLN, 100mL OMNIPAQUE IOHEXOL 300 MG/ML SOLN  COMPARISON:  None.  FINDINGS: The lung bases are clear. The liver enhances with no focal abnormality and no ductal dilatation is seen. No calcified gallstones are noted. The pancreas is normal in size and the pancreatic duct is not dilated. The adrenal glands and spleen are unremarkable. The stomach is moderately fluid distended with no significant abnormality noted. The kidneys enhance with no calculus or mass and no hydronephrosis is seen. The abdominal aorta is normal in caliber. Only a few small retroperitoneal nodes are present.  The appendix is well-visualized being very low in the right pelvis anteriorly. The appendix appears normal filling with air. However, there is abnormality of the terminal ileum with mucosal edema present. Immediately adjacent to the terminal ileum there is an amorphous collection of 3.9 x 3.5 cm most consistent with abscess. The primary consideration is that of inflammatory bowel disease i.e. Crohn's disease with associated abscess. Several prominent adjacent lymph nodes in the right lower quadrant are present as well. No abnormality of the colon is seen. The urinary bladder is unremarkable. The prostate is normal in size. There is a tiny amount of free fluid layering  posteriorly within the pelvis. The lumbar vertebrae are in normal alignment.  IMPRESSION: 1. Apparent edema of the terminal ileum with probable adjacent abscess most consistent with Crohn's disease with associated abscess formation. 2. The appendix is well seen and is normal very low in the right bony pelvis.   Electronically Signed   By: Dwyane DeePaul  Barry M.D.   On: 12/12/2013 12:58   PREVIOUS ENDOSCOPIES:            none   Impression / Plan:    Pleasant 38 year old male with acute abdominal pain / recent constipation in setting of CTscan suggestive of TI inflammation / adjacent abscess. Unusual presentation for Crohn's  in that symptoms so acute. Infectious etiology? No window for abscess drainage per IR. Surgery following. Patient on antibiotics with plans for repeat imaging in a few days. Depending on clinical course he may need colonoscopy with TI intubation to evaluate for possible Crohn's. Clear liquids okay from GI standpoint. Will follow along.   Thanks   LOS: 1 day   Willette Clusteraula Guenther  12/13/2013, 11:03 AM  Attending MD note:   I have taken a history, examined the patient, and reviewed the chart.I have reviewed the CT scan with the radiologist. I  Large abscess adjacent to terminal ileum may be due to Crohn's ileitis, foreign body perforation, Meckel's diverticulitis,  Infectious ileitis( Yersinia), doubt TB. He is doing better on broad spectrum antibiotics and I agree to continue conservative non- surgical treatment. Plan to repeat CT scan in 3 days and reassess  possibility of percutaneous drainage. Eventual colonoscopy  Depending on response. IBD markers .  Willa Roughora Lewayne Pauley,MD [facility] Pager # [phone]

## 2013-12-13 NOTE — Plan of Care (Signed)
Problem: Phase I Progression Outcomes Goal: Pain controlled with appropriate interventions Outcome: Completed/Met Date Met:  12/13/13 Goal: OOB as tolerated unless otherwise ordered Outcome: Completed/Met Date Met:  12/13/13 Goal: Voiding-avoid urinary catheter unless indicated Outcome: Completed/Met Date Met:  12/13/13  Problem: Phase II Progression Outcomes Goal: Progress activity as tolerated unless otherwise ordered Outcome: Completed/Met Date Met:  12/13/13

## 2013-12-14 LAB — BASIC METABOLIC PANEL
Anion gap: 14 (ref 5–15)
BUN: 9 mg/dL (ref 6–23)
CO2: 25 meq/L (ref 19–32)
Calcium: 8.9 mg/dL (ref 8.4–10.5)
Chloride: 104 mEq/L (ref 96–112)
Creatinine, Ser: 1.03 mg/dL (ref 0.50–1.35)
GFR calc Af Amer: >90 mL/min (ref >90)
GFR calc non Af Amer: >90 mL/min (ref >90)
Glucose, Bld: 84 mg/dL (ref 70–99)
POTASSIUM: 4 meq/L (ref 3.7–5.3)
SODIUM: 143 meq/L (ref 137–147)

## 2013-12-14 LAB — CBC
HCT: 40.3 % (ref 39.0–52.0)
HEMOGLOBIN: 12.9 g/dL — AB (ref 13.0–17.0)
MCH: 28.7 pg (ref 26.0–34.0)
MCHC: 32 g/dL (ref 30.0–36.0)
MCV: 89.6 fL (ref 78.0–100.0)
Platelets: 242 10*3/uL (ref 150–400)
RBC: 4.5 MIL/uL (ref 4.22–5.81)
RDW: 12.9 % (ref 11.5–15.5)
WBC: 8.4 10*3/uL (ref 4.0–10.5)

## 2013-12-14 MED ORDER — SODIUM CHLORIDE 0.9 % IV SOLN
INTRAVENOUS | Status: DC
  Administered 2013-12-15: 01:00:00 via INTRAVENOUS

## 2013-12-14 NOTE — Progress Notes (Addendum)
Subjective: 1 solid and 1 liquid BM, much less pain  Objective: Vital signs in last 24 hours: Temp:  [98.2 F (36.8 C)-98.4 F (36.9 C)] 98.4 F (36.9 C) (11/21 0554) Pulse Rate:  [62-68] 68 (11/21 0554) Resp:  [18-20] 20 (11/21 0554) BP: (102-116)/(58-74) 102/58 mmHg (11/21 0554) SpO2:  [98 %-100 %] 98 % (11/21 0554) Last BM Date: 12/13/13  Intake/Output from previous day: 11/20 0701 - 11/21 0700 In: 2322.5 [P.O.:1080; I.V.:1192.5; IV Piggyback:50] Out: -  Intake/Output this shift: Total I/O In: 360 [P.O.:360] Out: -   General appearance: alert and cooperative Resp: clear to auscultation bilaterally Cardio: S1, S2 normal GI: soft, minimal RLQ TTP, no guarding  Lab Results:   Recent Labs  12/13/13 0527 12/14/13 0708  WBC 10.6* 8.4  HGB 13.5 12.9*  HCT 41.1 40.3  PLT 231 242   BMET  Recent Labs  12/13/13 0527 12/14/13 0708  NA 143 143  K 4.2 4.0  CL 103 104  CO2 28 25  GLUCOSE 78 84  BUN 14 9  CREATININE 1.15 1.03  CALCIUM 9.1 8.9   PT/INR No results for input(s): LABPROT, INR in the last 72 hours. ABG No results for input(s): PHART, HCO3 in the last 72 hours.  Invalid input(s): PCO2, PO2  Studies/Results: Ct Abdomen Pelvis W Contrast  12/12/2013   CLINICAL DATA:  Right lower quadrant pain for 2 weeks, worsening recently  EXAM: CT ABDOMEN AND PELVIS WITH CONTRAST  TECHNIQUE: Multidetector CT imaging of the abdomen and pelvis was performed using the standard protocol following bolus administration of intravenous contrast.  CONTRAST:  30mL OMNIPAQUE IOHEXOL 300 MG/ML SOLN, OMNIPAQUE IOHEXOL 300 MG/ML SOLN  COMPARISON:  None.  FINDINGS: The lung bases are clear. The liver enhances with no focal abnormality and no ductal dilatation is seen. No calcified gallstones are noted. The pancreas is normal in size and the pancreatic duct is not dilated. The adrenal glands and spleen are unremarkable. The stomach is moderately fluid distended with no  significant abnormality noted. The kidneys enhance with no calculus or mass and no hydronephrosis is seen. The abdominal aorta is normal in caliber. Only a few small retroperitoneal nodes are present.  The appendix is well-visualized being very low in the right pelvis anteriorly. The appendix appears normal filling with air. However, there is abnormality of the terminal ileum with mucosal edema present. Immediately adjacent to the terminal ileum there is an amorphous collection of 3.9 x 3.5 cm most consistent with abscess. The primary consideration is that of inflammatory bowel disease i.e. Crohn's disease with associated abscess. Several prominent adjacent lymph nodes in the right lower quadrant are present as well. No abnormality of the colon is seen. The urinary bladder is unremarkable. The prostate is normal in size. There is a tiny amount of free fluid layering posteriorly within the pelvis. The lumbar vertebrae are in normal alignment.  IMPRESSION: 1. Apparent edema of the terminal ileum with probable adjacent abscess most consistent with Crohn's disease with associated abscess formation. 2. The appendix is well seen and is normal very low in the right bony pelvis.   Electronically Signed   By: Dwyane Dee M.D.   On: 12/12/2013 12:58    Anti-infectives: Anti-infectives    Start     Dose/Rate Route Frequency Ordered Stop   12/13/13 0100  piperacillin-tazobactam (ZOSYN) IVPB 3.375 g  Status:  Discontinued     3.375 g12.5 mL/hr over 240 Minutes Intravenous 3 times per day 12/12/13 1641 12/12/13 2158  12/12/13 2200  piperacillin-tazobactam (ZOSYN) IVPB 3.375 g     3.375 g12.5 mL/hr over 240 Minutes Intravenous 3 times per day 12/12/13 1848     12/12/13 1645  piperacillin-tazobactam (ZOSYN) IVPB 3.375 g  Status:  Discontinued     3.375 g100 mL/hr over 30 Minutes Intravenous  Once 12/12/13 1641 12/12/13 1643   12/12/13 1645  piperacillin-tazobactam (ZOSYN) IVPB 3.375 g  Status:  Discontinued     3.375  g100 mL/hr over 30 Minutes Intravenous 3 times per day 12/12/13 1643 12/12/13 1847      Assessment/Plan: RLQ abscess  - improving, appreciate GI F/U, try fulls and if stable exam will CT in AM for F/U  LOS: 2 days    THOMPSON,BURKE E 12/14/2013

## 2013-12-14 NOTE — Plan of Care (Signed)
Problem: Phase III Progression Outcomes Goal: Activity at appropriate level-compared to baseline (UP IN CHAIR FOR HEMODIALYSIS)  Outcome: Completed/Met Date Met:  12/14/13     

## 2013-12-14 NOTE — Progress Notes (Addendum)
Patient Demographics  Nathaniel West, is a 38 y.o. male, DOB - 01-14-1976, VHQ:[id number]  Admit date - 12/12/2013   Admitting Physician Richarda Overlie, MD  Outpatient Primary MD for the patient is No primary care provider on file.  LOS - 2   Chief Complaint  Patient presents with   Abdominal Pain        Subjective:   Nathaniel West today has, No headache, No chest pain, mild RLQ abdominal pain - No Nausea, No new weakness tingling or numbness, No Cough - SOB.   Assessment & Plan    1. Right lower quadrant pain with terminal ileum abscess suspicious for possible IBD with abscess formation. Currently being seen by general surgery, GI has been called, continue bowel rest, IV fluids,  IV Zosyn, no clear windows for drain placement. Repeat CT scan on monday. We will defer further management to GI and surgery. Stable B-12 level.     Code Status: full  Family Communication: none present  Disposition Plan: Likely home   Procedures CT abd-Pelvis   Consults  GI, CCS   Medications  Scheduled Meds:  enoxaparin (LOVENOX) injection  40 mg Subcutaneous Q24H   piperacillin-tazobactam (ZOSYN)  IV  3.375 g Intravenous 3 times per day   Continuous Infusions:  sodium chloride 100 mL/hr at 12/14/13 0652   PRN Meds:.acetaminophen **OR** [DISCONTINUED] acetaminophen, levalbuterol, [DISCONTINUED] ondansetron **OR** ondansetron (ZOFRAN) IV  DVT Prophylaxis  Lovenox    Lab Results  Component Value Date   PLT 242 12/14/2013    Antibiotics    Anti-infectives    Start     Dose/Rate Route Frequency Ordered Stop   12/13/13 0100  piperacillin-tazobactam (ZOSYN) IVPB 3.375 g  Status:  Discontinued     3.375 g12.5 mL/hr over 240 Minutes Intravenous 3 times per day 12/12/13 1641 12/12/13 2158   12/12/13  2200  piperacillin-tazobactam (ZOSYN) IVPB 3.375 g     3.375 g12.5 mL/hr over 240 Minutes Intravenous 3 times per day 12/12/13 1848     12/12/13 1645  piperacillin-tazobactam (ZOSYN) IVPB 3.375 g  Status:  Discontinued     3.375 g100 mL/hr over 30 Minutes Intravenous  Once 12/12/13 1641 12/12/13 1643   12/12/13 1645  piperacillin-tazobactam (ZOSYN) IVPB 3.375 g  Status:  Discontinued     3.375 g100 mL/hr over 30 Minutes Intravenous 3 times per day 12/12/13 1643 12/12/13 1847          Objective:   Filed Vitals:   12/13/13 0633 12/13/13 1424 12/13/13 2216 12/14/13 0554  BP: 103/58 113/66 116/74 102/58  Pulse: 66  62 68  Temp: 98.7 F (37.1 C) 98.2 F (36.8 C) 98.2 F (36.8 C) 98.4 F (36.9 C)  TempSrc: Oral  Oral Oral  Resp: 20 18 20 20   Height:      Weight:      SpO2: 98% 100% 100% 98%    Wt Readings from Last 3 Encounters:  12/12/13 77.111 kg (170 lb)     Intake/Output Summary (Last 24 hours) at 12/14/13 1010 Last data filed at 12/13/13 2135  Gross per 24 hour  Intake 2322.51 ml  Output      0 ml  Net 2322.51 ml     Physical Exam  Awake Alert, Oriented  X 3, No new F.N deficits, Normal affect Nathaniel West.AT,PERRAL Supple Neck,No JVD, No cervical lymphadenopathy appriciated.  Symmetrical Chest wall movement, Good air movement bilaterally, CTAB RRR,No Gallops,Rubs or new Murmurs, No Parasternal Heave +ve B.Sounds, Abd Soft, +ve RLQ tenderness, No organomegaly appriciated, No rebound - guarding or rigidity. No Cyanosis, Clubbing or edema, No new Rash or bruise      Data Review   Micro Results No results found for this or any previous visit (from the past 240 hour(s)).  Radiology Reports Ct Abdomen Pelvis W Contrast  12/12/2013   CLINICAL DATA:  Right lower quadrant pain for 2 weeks, worsening recently  EXAM: CT ABDOMEN AND PELVIS WITH CONTRAST  TECHNIQUE: Multidetector CT imaging of the abdomen and pelvis was performed using the standard protocol following bolus  administration of intravenous contrast.  CONTRAST:  30mL OMNIPAQUE IOHEXOL 300 MG/ML SOLN, OMNIPAQUE IOHEXOL 300 MG/ML SOLN  COMPARISON:  None.  FINDINGS: The lung bases are clear. The liver enhances with no focal abnormality and no ductal dilatation is seen. No calcified gallstones are noted. The pancreas is normal in size and the pancreatic duct is not dilated. The adrenal glands and spleen are unremarkable. The stomach is moderately fluid distended with no significant abnormality noted. The kidneys enhance with no calculus or mass and no hydronephrosis is seen. The abdominal aorta is normal in caliber. Only a few small retroperitoneal nodes are present.  The appendix is well-visualized being very low in the right pelvis anteriorly. The appendix appears normal filling with air. However, there is abnormality of the terminal ileum with mucosal edema present. Immediately adjacent to the terminal ileum there is an amorphous collection of 3.9 x 3.5 cm most consistent with abscess. The primary consideration is that of inflammatory bowel disease i.e. Crohn's disease with associated abscess. Several prominent adjacent lymph nodes in the right lower quadrant are present as well. No abnormality of the colon is seen. The urinary bladder is unremarkable. The prostate is normal in size. There is a tiny amount of free fluid layering posteriorly within the pelvis. The lumbar vertebrae are in normal alignment.  IMPRESSION: 1. Apparent edema of the terminal ileum with probable adjacent abscess most consistent with Crohn's disease with associated abscess formation. 2. The appendix is well seen and is normal very low in the right bony pelvis.   Electronically Signed   By: Dwyane Dee M.D.   On: 12/12/2013 12:58     CBC  Recent Labs Lab 12/12/13 1520 12/12/13 1725 12/13/13 0527 12/14/13 0708  WBC 8.5 8.6 10.6* 8.4  HGB 13.1 13.6 13.5 12.9*  HCT 39.5 40.7 41.1 40.3  PLT 236 270 231 242  MCV 88.6 87.7 90.7 89.6   MCH 29.4 29.3 29.8 28.7  MCHC 33.2 33.4 32.8 32.0  RDW 12.8 13.0 12.9 12.9  LYMPHSABS 1.9  --   --   --   MONOABS 0.5  --   --   --   EOSABS 0.1  --   --   --   BASOSABS 0.0  --   --   --     Chemistries   Recent Labs Lab 12/12/13 1520 12/12/13 1725 12/13/13 0527 12/14/13 0708  NA 140  --  143 143  K 4.4  --  4.2 4.0  CL 98  --  103 104  CO2 30  --  28 25  GLUCOSE 147*  --  78 84  BUN 13  --  14 9  CREATININE 0.93 0.91 1.15  1.03  CALCIUM 9.4  --  9.1 8.9  AST 19 18 15   --   ALT 15 14 12   --   ALKPHOS 69 71 66  --   BILITOT 0.4 0.4 0.5  --    ------------------------------------------------------------------------------------------------------------------ estimated creatinine clearance is 107.1 mL/min (by C-G formula based on Cr of 1.03). ------------------------------------------------------------------------------------------------------------------ No results for input(s): HGBA1C in the last 72 hours. ------------------------------------------------------------------------------------------------------------------ No results for input(s): CHOL, HDL, LDLCALC, TRIG, CHOLHDL, LDLDIRECT in the last 72 hours. ------------------------------------------------------------------------------------------------------------------  Recent Labs  12/12/13 1725  TSH 2.200   ------------------------------------------------------------------------------------------------------------------  Recent Labs  12/13/13 0953  VITAMINB12 676    Coagulation profile No results for input(s): INR, PROTIME in the last 168 hours.  No results for input(s): DDIMER in the last 72 hours.  Cardiac Enzymes No results for input(s): CKMB, TROPONINI, MYOGLOBIN in the last 168 hours.  Invalid input(s): CK ------------------------------------------------------------------------------------------------------------------ Invalid input(s): POCBNP     Time Spent in minutes  35   Calvert Charland  K M.D on 12/14/2013 at 10:10 AM  Between 7am to 7pm - Pager - [phone]  After 7pm go to [email or web address] - password [email or web address]  And look for the night coverage person covering for me after hours  [facility] Office  [phone]

## 2013-12-14 NOTE — Progress Notes (Signed)
Progress Note   Subjective  feels very good today. No abdominal pain. Hungry   Objective   Vital signs in last 24 hours: Temp:  [98.2 F (36.8 C)-98.4 F (36.9 C)] 98.4 F (36.9 C) (11/21 0554) Pulse Rate:  [62-68] 68 (11/21 0554) Resp:  [18-20] 20 (11/21 0554) BP: (102-116)/(58-74) 102/58 mmHg (11/21 0554) SpO2:  [98 %-100 %] 98 % (11/21 0554) Last BM Date: 12/13/13 General:    Pleasant white male in NAD Lungs: Respirations even and unlabored, lungs CTA bilaterally Abdomen:  Soft, nontender and nondistended. Normal bowel sounds. Extremities:  Without edema. Neurologic:  Alert and oriented,  grossly normal neurologically. Psych:  Cooperative. Normal mood and affect.  Lab Results:  Recent Labs  12/12/13 1725 12/13/13 0527 12/14/13 0708  WBC 8.6 10.6* 8.4  HGB 13.6 13.5 12.9*  HCT 40.7 41.1 40.3  PLT 270 231 242   BMET  Recent Labs  12/12/13 1520 12/12/13 1725 12/13/13 0527 12/14/13 0708  NA 140  --  143 143  K 4.4  --  4.2 4.0  CL 98  --  103 104  CO2 30  --  28 25  GLUCOSE 147*  --  78 84  BUN 13  --  14 9  CREATININE 0.93 0.91 1.15 1.03  CALCIUM 9.4  --  9.1 8.9   LFT  Recent Labs  12/12/13 1725 12/13/13 0527  PROT 7.5 6.6  ALBUMIN 3.4* 3.1*  AST 18 15  ALT 14 12  ALKPHOS 71 66  BILITOT 0.4 0.5  BILIDIR <0.2  --   IBILI NOT CALCULATED  --     Studies/Results: Ct Abdomen Pelvis W Contrast  12/12/2013   CLINICAL DATA:  Right lower quadrant pain for 2 weeks, worsening recently  EXAM: CT ABDOMEN AND PELVIS WITH CONTRAST  TECHNIQUE: Multidetector CT imaging of the abdomen and pelvis was performed using the standard protocol following bolus administration of intravenous contrast.  CONTRAST:  30mL OMNIPAQUE IOHEXOL 300 MG/ML SOLN, 100mL OMNIPAQUE IOHEXOL 300 MG/ML SOLN  COMPARISON:  None.  FINDINGS: The lung bases are clear. The liver enhances with no focal abnormality and no ductal dilatation is seen. No calcified gallstones are noted. The  pancreas is normal in size and the pancreatic duct is not dilated. The adrenal glands and spleen are unremarkable. The stomach is moderately fluid distended with no significant abnormality noted. The kidneys enhance with no calculus or mass and no hydronephrosis is seen. The abdominal aorta is normal in caliber. Only a few small retroperitoneal nodes are present.  The appendix is well-visualized being very low in the right pelvis anteriorly. The appendix appears normal filling with air. However, there is abnormality of the terminal ileum with mucosal edema present. Immediately adjacent to the terminal ileum there is an amorphous collection of 3.9 x 3.5 cm most consistent with abscess. The primary consideration is that of inflammatory bowel disease i.e. Crohn's disease with associated abscess. Several prominent adjacent lymph nodes in the right lower quadrant are present as well. No abnormality of the colon is seen. The urinary bladder is unremarkable. The prostate is normal in size. There is a tiny amount of free fluid layering posteriorly within the pelvis. The lumbar vertebrae are in normal alignment.  IMPRESSION: 1. Apparent edema of the terminal ileum with probable adjacent abscess most consistent with Crohn's disease with associated abscess formation. 2. The appendix is well seen and is normal very low in the right bony pelvis.   Electronically Signed  By: Dwyane DeePaul  Barry M.D.   On: 12/12/2013 12:58     Assessment / Plan:    38 year old male with ileitus / abscess. Unusual presentation for Crohn's but it isn't excluded. No window for abscess drainage per IR. Surgery following. Patient on IV antibiotics with plans for repeat imaging in a few days.He feels great today, interesting in going home.  Depending on clinical course he may need colonoscopy with TI intubation to evaluate for possible Crohn's.  Thanks    LOS: 2 days   Willette Clusteraula Guenther  12/14/2013, 9:08 AM Attending MD note:   I have taken a  history,and reviewed the chart. I agree with the Advanced Practitioner's impression and recommendations. He should not go home before re-imaging ? Tomorrow, continue IV antibiotics, then switch to orals after discharge  Willa Roughora Khadeeja Elden,MD [facility] Pager # [phone]

## 2013-12-15 ENCOUNTER — Inpatient Hospital Stay (HOSPITAL_COMMUNITY): Payer: BC Managed Care – PPO

## 2013-12-15 LAB — CBC
HEMATOCRIT: 41.8 % (ref 39.0–52.0)
Hemoglobin: 13.6 g/dL (ref 13.0–17.0)
MCH: 29.1 pg (ref 26.0–34.0)
MCHC: 32.5 g/dL (ref 30.0–36.0)
MCV: 89.3 fL (ref 78.0–100.0)
Platelets: 290 10*3/uL (ref 150–400)
RBC: 4.68 MIL/uL (ref 4.22–5.81)
RDW: 13 % (ref 11.5–15.5)
WBC: 7.2 10*3/uL (ref 4.0–10.5)

## 2013-12-15 MED ORDER — METRONIDAZOLE 500 MG PO TABS: 500.0000 mg | ORAL_TABLET | Freq: Three times a day (TID) | ORAL | Status: DC

## 2013-12-15 MED ORDER — HYDROCODONE-ACETAMINOPHEN 5-325 MG PO TABS: 1.0000 | ORAL_TABLET | Freq: Four times a day (QID) | ORAL | Status: DC | PRN

## 2013-12-15 MED ORDER — IOHEXOL 300 MG/ML  SOLN
25.0000 mL | INTRAMUSCULAR | Status: AC
  Administered 2013-12-15 (×2): 25 mL via ORAL

## 2013-12-15 MED ORDER — IOHEXOL 300 MG/ML  SOLN
100.0000 mL | Freq: Once | INTRAMUSCULAR | Status: AC | PRN
  Administered 2013-12-15: 100 mL via INTRAVENOUS

## 2013-12-15 MED ORDER — CIPROFLOXACIN HCL 500 MG PO TABS
500.0000 mg | ORAL_TABLET | Freq: Two times a day (BID) | ORAL | Status: DC
Start: 2013-12-15 — End: 2014-07-13

## 2013-12-15 NOTE — Progress Notes (Signed)
Patient ID: Nathaniel West, male   DOB: 10/01/1975, 38 y.o.   MRN: [id number]    Subjective: Pt feels well today.  Only pain with deep palpation.  Tolerating full liquids, but having some bloating.  Some diarrhea.  Objective: Vital signs in last 24 hours: Temp:  [98 F (36.7 C)-98.6 F (37 C)] 98.6 F (37 C) (11/22 0547) Pulse Rate:  [62-64] 64 (11/22 0547) Resp:  [18-20] 18 (11/22 0547) BP: (108-112)/(61-69) 112/61 mmHg (11/22 0547) SpO2:  [97 %-100 %] 97 % (11/22 0547) Last BM Date: 12/14/13  Intake/Output from previous day: 11/21 0701 - 11/22 0700 In: 1132 [P.O.:582; I.V.:450; IV Piggyback:100] Out: 1 [Urine:1] Intake/Output this shift:    PE: Abd: soft, minimally tender to deep palpation in RLQ, +BS  Lab Results:   Recent Labs  12/14/13 0708 12/15/13 0532  WBC 8.4 7.2  HGB 12.9* 13.6  HCT 40.3 41.8  PLT 242 290   BMET  Recent Labs  12/13/13 0527 12/14/13 0708  NA 143 143  K 4.2 4.0  CL 103 104  CO2 28 25  GLUCOSE 78 84  BUN 14 9  CREATININE 1.15 1.03  CALCIUM 9.1 8.9   PT/INR No results for input(s): LABPROT, INR in the last 72 hours. CMP     Component Value Date/Time   NA 143 12/14/2013 0708   K 4.0 12/14/2013 0708   CL 104 12/14/2013 0708   CO2 25 12/14/2013 0708   GLUCOSE 84 12/14/2013 0708   BUN 9 12/14/2013 0708   CREATININE 1.03 12/14/2013 0708   CALCIUM 8.9 12/14/2013 0708   PROT 6.6 12/13/2013 0527   ALBUMIN 3.1* 12/13/2013 0527   AST 15 12/13/2013 0527   ALT 12 12/13/2013 0527   ALKPHOS 66 12/13/2013 0527   BILITOT 0.5 12/13/2013 0527   GFRNONAA >90 12/14/2013 0708   GFRAA >90 12/14/2013 0708   Lipase  No results found for: LIPASE     Studies/Results: Ct Pelvis W Contrast  12/15/2013   CLINICAL DATA:  Pelvic abscess, followup  EXAM: CT PELVIS WITH CONTRAST  TECHNIQUE: Multidetector CT imaging of the pelvis was performed using the standard protocol following the bolus administration of intravenous contrast. Sagittal and  coronal MPR images reconstructed from axial data set.  CONTRAST:  100mL OMNIPAQUE IOHEXOL 300 MG/ML SOLN IV. Dilute oral contrast.  COMPARISON:  12/12/2013  FINDINGS: Abscess collection in RIGHT pelvis adjacent to terminal ileum compatible with Crohn's disease.  Collection demonstrates slightly increased thickening of the wall with decreased fluid component and decreased overall size, now measuring 3.2 x 3.1 cm, previously 3.9 x 3.5 cm.  Persistent thickening of terminal ileal wall with scattered reactive lymph nodes.  Minimal free pelvic fluid.  No new pelvic abscess collections identified.  Remaining pelvic bowel loops unremarkable.  Normal appendix.  Normal appearing bladder and ureters.  No mass, hernia or acute bone lesion.  IMPRESSION: Slight decrease in size of abscess collection identified adjacent to the terminal ileum.  Persistent wall thickening of the terminal ileum consistent with enteritis, question Crohns disease.  Minimal free pelvic fluid and scattered reactive lymph nodes noted.   Electronically Signed   By: Ulyses SouthwardMark  Boles M.D.   On: 12/15/2013 10:09    Anti-infectives: Anti-infectives    Start     Dose/Rate Route Frequency Ordered Stop   12/13/13 0100  piperacillin-tazobactam (ZOSYN) IVPB 3.375 g  Status:  Discontinued     3.375 g12.5 mL/hr over 240 Minutes Intravenous 3 times per day 12/12/13 1641 12/12/13  2158   12/12/13 2200  piperacillin-tazobactam (ZOSYN) IVPB 3.375 g     3.375 g12.5 mL/hr over 240 Minutes Intravenous 3 times per day 12/12/13 1848     12/12/13 1645  piperacillin-tazobactam (ZOSYN) IVPB 3.375 g  Status:  Discontinued     3.375 g100 mL/hr over 30 Minutes Intravenous  Once 12/12/13 1641 12/12/13 1643   12/12/13 1645  piperacillin-tazobactam (ZOSYN) IVPB 3.375 g  Status:  Discontinued     3.375 g100 mL/hr over 30 Minutes Intravenous 3 times per day 12/12/13 1643 12/12/13 1847       Assessment/Plan  1. Intra-abdominal abscess, ileitis likely crohn's  disease  Plan: 1. CT scan shows improvement of abscess.  It is still surrounded by small bowel and not drainable.  I agree with GI that it is ok to DC  Home on Cipro/Flagyl.  Would give 14 days with a refill.  I will have our office set up a repeat CT scan in about 2 weeks and then have him follow up with Dr. Corliss Skainssuei in 2 weeks. 2. Full liquids and low fiber diet.   LOS: 3 days    Samantha Ragen E 12/15/2013, 10:42 AM Pager: [phone]

## 2013-12-15 NOTE — Plan of Care (Signed)
Problem: Consults Goal: General Medical Patient Education See Patient Education Module for specific education.  Outcome: Completed/Met Date Met:  12/15/13 Goal: Skin Care Protocol Initiated - if Braden Score 18 or less If consults are not indicated, leave blank or document N/A  Outcome: Not Applicable Date Met:  80/32/12 Goal: Nutrition Consult-if indicated Outcome: Not Applicable Date Met:  24/82/50 Goal: Diabetes Guidelines if Diabetic/Glucose > 140 If diabetic or lab glucose is > 140 mg/dl - Initiate Diabetes/Hyperglycemia Guidelines & Document Interventions  Outcome: Not Applicable Date Met:  03/70/48

## 2013-12-15 NOTE — Discharge Summary (Addendum)
Nathaniel West, is a 38 y.o. male  DOB [birth year]  MRN [id number].  Admission date:  12/12/2013  Admitting Physician  Richarda Overlie, MD  Discharge Date:  12/15/2013  Primary MD  No primary care provider on file.  Recommendations for primary care physician for things to follow:      Admission Diagnosis   Intra-abdominal abscess   Discharge Diagnosis   Intra-abdominal abscess     Active Problems:   Intra-abdominal abscess      History reviewed. No pertinent past medical history.  History reviewed. No pertinent past surgical history.     History of present illness and  Hospital Course:     Kindly see H&P for history of present illness and admission details, please review complete Labs, Consult reports and Test reports for all details in brief -   HPI  from the history and physical done on the day of admission  38 yo white male who is otherwise healthy who began having abdominal pain in the RLQ 2 weeks ago.  He developed a fever a low-grade fever when his symptoms started. 2 weeks ago.  Despite resolution of his constipation the patient continued to have  RLQ abdominal pain He developed some bloating and slight decrease in appetite.  He saw his PCP this past Monday and felt his pain was secondary to his. constipation still.  He continued to have more pain and went back yesterday.  He felt he may have appendicitis so sent him for a CT scan today.  The scan reveals a 3.5x3.9cm abscess in the RLQ with terminal inflammatory changes, possibly related to crohn's disease. No prior history of Crohn's disease. Denies any recent weight loss. He denies any hematochezia or melena. His appendix is seen and is normal.  He also has a small amount of hydronephrosis on that side secondary to this process as well.  General surgery was consulted for  intra-abdominal abscess.   Hospital Course    1. Right lower quadrant pain with terminal ileum abscess suspicious possible IBD with abscess formation. Currently being seen by general surgery, GI has been called, improved bowel rest, IV fluids,  IV Zosyn, no clear windows for drain placement. Repeat CT scan shows improvement, B12 stable. Per GI and Surgery 2 weeks PO ABX, Low residual diet, out pt GI & CCS followup.   Addendum 11/21/2022.  Contacted by physician through the healthcare medical records department that patient has been informed by his gastroenterologist that he does not have Crohn's disease, he requested to change the diagnosis of possible Crohn's disease from his chart.  As he has been ruled out I do not see any reason why this cannot be removed.    Discharge Condition: Stable   Follow UP  Follow-up Information    Follow up with Lina Sar, MD. Schedule an appointment as soon as possible for a visit in 1 week.   Specialty:  Gastroenterology   Contact information:   [address] [place] [address] [phone]  Follow up with Wynona Luna., MD. Schedule an appointment as soon as possible for a visit in 2 weeks.   Specialty:  General Surgery   Contact information:   [address] [place] [address] [phone]         Discharge Instructions  and  Discharge Medications      Discharge Instructions    Discharge instructions    Complete by:  As directed   Full Liquid Diet  A full liquid diet may be used:  To help you transition from a clear liquid diet to a soft diet.   When your body is healing and can only tolerate foods that are easy to digest. Before or after certain a procedure, test, or surgery (such as stomach or intestinal surgeries).   If you have trouble swallowing or chewing.   A full liquid diet includes fluids and foods that are liquid or will become liquid at room temperature. The full liquid diet gives you the  proteins, fluids, salts, and minerals that you need for energy. If you continue this diet for more than 72 hours, talk to your health care provider about how many calories you need to consume. If you continue the diet for more than 5 days, talk to your health care provider about taking a multivitamin or a nutritional supplement. WHAT DO I NEED TO KNOW ABOUT A FULL LIQUID DIET? You may have any liquid. You may have any food that becomes a liquid at room temperature. The food is considered a liquid if it can be poured off a spoon at room temperature. Drink one serving of citrus or vitamin C-enriched fruit juice daily. WHAT FOODS CAN I EAT? Grains Any grain food that can be pureed in soup (such as crackers, pasta, and rice). Hot cereal (such as farina or oatmeal) that has been blended. Talk to your health care provider or dietitian about these foods. Vegetables Pulp-free tomato or vegetable juice. Vegetables pureed in soup.  Fruits Fruit juice, including nectars and juices with pulp. Meats and Other Protein Sources Eggs in custard, eggnog mix, and eggs used in ice cream or pudding. Strained meats, like in baby food, may be allowed. Consult your health care provider.  Dairy Milk and milk-based beverages, including milk shakes and instant breakfast mixes. Smooth yogurt. Pureed cottage cheese. Avoid these foods if they are not well tolerated. Beverages All beverages, including liquid nutritional supplements. Ask your health care provider if you can have carbonated beverages. They may not be well tolerated. Condiments Iodized salt, pepper, spices, and flavorings. Cocoa powder. Vinegar, ketchup, yellow mustard, smooth sauces (such as hollandaise, cheese sauce, or white sauce), and soy sauce. Sweets and Desserts Custard, smooth pudding. Flavored gelatin. Tapioca, junket. Plain ice cream, sherbet, fruit ices. Frozen ice pops, frozen fudge pops, pudding pops, and other frozen bars with cream. Syrups,  including chocolate syrup. Sugar, honey, jelly.  Fats and Oils Margarine, butter, cream, sour cream, and oils. Other Broth and cream soups. Strained, broth-based soups. The items listed above may not be a complete list of recommended foods or beverages. Contact your dietitian for more options.  WHAT FOODS CAN I NOT EAT? Grains All breads. Grains are not allowed unless they are pureed into soup. Vegetables Vegetables are not allowed unless they are juiced, or cooked and pureed into soup. Fruits Fruits are not allowed unless they are juiced. Meats and Other Protein Sources Any meat or fish. Cooked or raw eggs. Nut butters.  Dairy Cheese.  Condiments  Stone ground mustards. Fats and Oils Fats that are coarse or chunky. Sweets and Desserts Ice cream or other frozen desserts that have any solids in them or on top, such as nuts, chocolate chips, and pieces of cookies. Cakes. Cookies. Candy. Others Soups with chunks or pieces in them. The items listed above may not be a complete list of foods and beverages to avoid. Contact your dietitian for more information. Document Released: 01/10/2005 Document Revised: 01/15/2013 Document Reviewed: 11/15/2012 [facility] Patient Information 2015 [place], [place]. This information is not intended to replace advice given to you by your health care provider. Make sure you discuss any questions you have with your health care provider.  Low-Fiber Diet Fiber is found in fruits, vegetables, and whole grains. A low-fiber diet restricts fibrous foods that are not digested in the small intestine. A diet containing about 10-15 grams of fiber per day is considered low fiber. Low-fiber diets may be used to: Promote healing and rest the bowel during intestinal flare-ups. Prevent blockage of a partially obstructed or narrowed gastrointestinal tract. Reduce fecal weight and volume. Slow the movement of feces. You may be on a low-fiber diet as a transitional diet  following surgery, after an injury (trauma), or because of a short (acute) or lifelong (chronic) illness. Your health care provider will determine the length of time you need to stay on this diet.  WHAT DO I NEED TO KNOW ABOUT A LOW-FIBER DIET? Always check the fiber content on the packaging's Nutrition Facts label, especially on foods from the grains list. Ask your dietitian if you have questions about specific foods that are related to your condition, especially if the food is not listed below. In general, a low-fiber food will have less than 2 g of fiber. WHAT FOODS CAN I EAT? Grains All breads and crackers made with white flour. Sweet rolls, doughnuts, waffles, pancakes, [place] toast, bagels. Pretzels, Melba toast, zwieback. Well-cooked cereals, such as cornmeal, farina, or cream cereals. Dry cereals that do not contain whole grains, fruit, or nuts, such as refined corn, wheat, rice, and oat cereals. Potatoes prepared any way without skins, plain pastas and noodles, refined white rice. Use white flour for baking and making sauces. Use allowed list of grains for casseroles, dumplings, and puddings.  Vegetables Strained tomato and vegetable juices. Fresh lettuce, cucumber, spinach. Well-cooked (no skin or pulp) or canned vegetables, such as asparagus, bean sprouts, beets, carrots, green beans, mushrooms, potatoes, pumpkin, spinach, yellow squash, tomato sauce/puree, turnips, yams, and zucchini. Keep servings limited to  cup.  Fruits All fruit juices except prune juice. Cooked or canned fruits without skin and seeds, such as applesauce, apricots, cherries, fruit cocktail, grapefruit, grapes, mandarin oranges, melons, peaches, pears, pineapple, and plums. Fresh fruits without skin, such as apricots, avocados, bananas, melons, pineapple, nectarines, and peaches. Keep servings limited to  cup or 1 piece.   Meat and Other Protein Sources Ground or well-cooked tender beef, ham, veal, lamb, pork, or poultry.  Eggs, plain cheese. Fish, oysters, shrimp, lobster, and other seafood. Liver, organ meats. Smooth nut butters. Dairy All milk products and alternative dairy substitutes, such as soy, rice, almond, and coconut, not containing added whole nuts, seeds, or added fruit. Beverages Decaf coffee, fruit, and vegetable juices or smoothies (small amounts, with no pulp or skins, and with fruits from allowed list), sports drinks, herbal tea. Condiments Ketchup, mustard, vinegar, cream sauce, cheese sauce, cocoa powder. Spices in moderation, such as allspice, basil, bay leaves, celery powder or leaves, cinnamon, cumin  powder, curry powder, ginger, mace, marjoram, onion or garlic powder, oregano, paprika, parsley flakes, ground pepper, rosemary, sage, savory, tarragon, thyme, and turmeric. Sweets and Desserts Plain cakes and cookies, pie made with allowed fruit, pudding, custard, cream pie. Gelatin, fruit, ice, sherbet, frozen ice pops. Ice cream, ice milk without nuts. Plain hard candy, honey, jelly, molasses, syrup, sugar, chocolate syrup, gumdrops, marshmallows. Limit overall sugar intake.  Fats and Oil Margarine, butter, cream, mayonnaise, salad oils, plain salad dressings made from allowed foods. Choose healthy fats such as olive oil, canola oil, and omega-3 fatty acids (such as found in salmon or tuna) when possible.  Other Bouillon, broth, or cream soups made from allowed foods. Any strained soup. Casseroles or mixed dishes made with allowed foods. The items listed above may not be a complete list of recommended foods or beverages. Contact your dietitian for more options.  WHAT FOODS ARE NOT RECOMMENDED? Grains All whole wheat and whole grain breads and crackers. Multigrains, rye, bran seeds, nuts, or coconut. Cereals containing whole grains, multigrains, bran, coconut, nuts, raisins. Cooked or dry oatmeal, steel-cut oats. Coarse wheat cereals, granola. Cereals advertised as high fiber. Potato skins. Whole  grain pasta, wild or brown rice. Popcorn. Coconut flour. Bran, buckwheat, corn bread, multigrains, rye, wheat germ.  Vegetables Fresh, cooked or canned vegetables, such as artichokes, asparagus, beet greens, broccoli, Brussels sprouts, cabbage, celery, cauliflower, corn, eggplant, kale, legumes or beans, okra, peas, and tomatoes. Avoid large servings of any vegetables, especially raw vegetables.  Fruits Fresh fruits, such as apples with or without skin, berries, cherries, figs, grapes, grapefruit, guavas, kiwis, mangoes, oranges, papayas, pears, persimmons, pineapple, and pomegranate. Prune juice and juices with pulp, stewed or dried prunes. Dried fruits, dates, raisins. Fruit seeds or skins. Avoid large servings of all fresh fruits. Meats and Other Protein Sources Tough, fibrous meats with gristle. Chunky nut butter. Cheese made with seeds, nuts, or other foods not recommended. Nuts, seeds, legumes (beans, including baked beans), dried peas, beans, lentils.  Dairy Yogurt or cheese that contains nuts, seeds, or added fruit.  Beverages Fruit juices with high pulp, prune juice. Caffeinated coffee and teas.  Condiments Coconut, maple syrup, pickles, olives. Sweets and Desserts Desserts, cookies, or candies that contain nuts or coconut, chunky peanut butter, dried fruits. Jams, preserves with seeds, marmalade. Large amounts of sugar and sweets. Any other dessert made with fruits from the not recommended list.  Other Soups made from vegetables that are not recommended or that contain other foods not recommended.  The items listed above may not be a complete list of foods and beverages to avoid. Contact your dietitian for more information. Document Released: 07/02/2001 Document Revised: 01/15/2013 Document Reviewed: 12/03/2012 [facility] Patient Information 2015 [place], [place]. This information is not intended to replace advice given to you by your health care provider. Make sure you discuss any  questions you have with your health care provider.   Follow with Primary MD No primary care provider on file. in 7 days   Get CBC, CMP, 2 view Chest X ray checked  by Primary MD next visit.    Activity: As tolerated with Full fall precautions use walker/cane & assistance as needed   Disposition Home     Diet: As Above   On your next visit with your primary care physician please Get Medicines reviewed and adjusted.   Please request your Prim.MD to go over all Hospital Tests and Procedure/Radiological results at the follow up, please get all Hospital records sent to your  Prim MD by signing hospital release before you go home.   If you experience worsening of your admission symptoms, develop shortness of breath, life threatening emergency, suicidal or homicidal thoughts you must seek medical attention immediately by calling 911 or calling your MD immediately  if symptoms less severe.  You Must read complete instructions/literature along with all the possible adverse reactions/side effects for all the Medicines you take and that have been prescribed to you. Take any new Medicines after you have completely understood and accpet all the possible adverse reactions/side effects.   Do not drive, operating heavy machinery, perform activities at heights, swimming or participation in water activities or provide baby sitting services if your were admitted for syncope or siezures until you have seen by Primary MD or a Neurologist and advised to do so again.  Do not drive when taking Pain medications.    Do not take more than prescribed Pain, Sleep and Anxiety Medications  Special Instructions: If you have smoked or chewed Tobacco  in the last 2 yrs please stop smoking, stop any regular Alcohol  and or any Recreational drug use.  Wear Seat belts while driving.   Please note  You were cared for by a hospitalist during your hospital stay. If you have any questions about your discharge  medications or the care you received while you were in the hospital after you are discharged, you can call the unit and asked to speak with the hospitalist on call if the hospitalist that took care of you is not available. Once you are discharged, your primary care physician will handle any further medical issues. Please note that NO REFILLS for any discharge medications will be authorized once you are discharged, as it is imperative that you return to your primary care physician (or establish a relationship with a primary care physician if you do not have one) for your aftercare needs so that they can reassess your need for medications and monitor your lab values.     Increase activity slowly    Complete by:  As directed             Medication List    STOP taking these medications        ibuprofen 200 MG tablet  Commonly known as:  ADVIL,MOTRIN      TAKE these medications        ciprofloxacin 500 MG tablet  Commonly known as:  CIPRO  Take 1 tablet (500 mg total) by mouth 2 (two) times daily.     HYDROcodone-acetaminophen 5-325 MG per tablet  Commonly known as:  NORCO/VICODIN  Take 1 tablet by mouth every 6 (six) hours as needed for moderate pain.     hyoscyamine 0.125 MG tablet  Commonly known as:  LEVSIN, ANASPAZ  Take 0.125 mg by mouth as needed for bladder spasms.     metroNIDAZOLE 500 MG tablet  Commonly known as:  FLAGYL  Take 1 tablet (500 mg total) by mouth 3 (three) times daily.          Diet and Activity recommendation: See Discharge Instructions above   Consults obtained - GI, CCS   Major procedures and Radiology Reports - PLEASE review detailed and final reports for all details, in brief -       Ct Pelvis W Contrast  12/15/2013   CLINICAL DATA:  Pelvic abscess, followup  EXAM: CT PELVIS WITH CONTRAST  TECHNIQUE: Multidetector CT imaging of the pelvis was performed using the standard protocol following the  bolus administration of intravenous contrast.  Sagittal and coronal MPR images reconstructed from axial data set.  CONTRAST:  OMNIPAQUE IOHEXOL 300 MG/ML SOLN IV. Dilute oral contrast.  COMPARISON:  12/12/2013  FINDINGS: Abscess collection in RIGHT pelvis adjacent to terminal ileum compatible with Crohn's disease.  Collection demonstrates slightly increased thickening of the wall with decreased fluid component and decreased overall size, now measuring 3.2 x 3.1 cm, previously 3.9 x 3.5 cm.  Persistent thickening of terminal ileal wall with scattered reactive lymph nodes.  Minimal free pelvic fluid.  No new pelvic abscess collections identified.  Remaining pelvic bowel loops unremarkable.  Normal appendix.  Normal appearing bladder and ureters.  No mass, hernia or acute bone lesion.  IMPRESSION: Slight decrease in size of abscess collection identified adjacent to the terminal ileum.  Persistent wall thickening of the terminal ileum consistent with enteritis, question Crohns disease.  Minimal free pelvic fluid and scattered reactive lymph nodes noted.   Electronically Signed   By: Ulyses Southward M.D.   On: 12/15/2013 10:09   Ct Abdomen Pelvis W Contrast  12/12/2013   CLINICAL DATA:  Right lower quadrant pain for 2 weeks, worsening recently  EXAM: CT ABDOMEN AND PELVIS WITH CONTRAST  TECHNIQUE: Multidetector CT imaging of the abdomen and pelvis was performed using the standard protocol following bolus administration of intravenous contrast.  CONTRAST:  30mL OMNIPAQUE IOHEXOL 300 MG/ML SOLN, OMNIPAQUE IOHEXOL 300 MG/ML SOLN  COMPARISON:  None.  FINDINGS: The lung bases are clear. The liver enhances with no focal abnormality and no ductal dilatation is seen. No calcified gallstones are noted. The pancreas is normal in size and the pancreatic duct is not dilated. The adrenal glands and spleen are unremarkable. The stomach is moderately fluid distended with no significant abnormality noted. The kidneys enhance with no calculus or mass and no hydronephrosis  is seen. The abdominal aorta is normal in caliber. Only a few small retroperitoneal nodes are present.  The appendix is well-visualized being very low in the right pelvis anteriorly. The appendix appears normal filling with air. However, there is abnormality of the terminal ileum with mucosal edema present. Immediately adjacent to the terminal ileum there is an amorphous collection of 3.9 x 3.5 cm most consistent with abscess. The primary consideration is that of inflammatory bowel disease i.e. Crohn's disease with associated abscess. Several prominent adjacent lymph nodes in the right lower quadrant are present as well. No abnormality of the colon is seen. The urinary bladder is unremarkable. The prostate is normal in size. There is a tiny amount of free fluid layering posteriorly within the pelvis. The lumbar vertebrae are in normal alignment.  IMPRESSION: 1. Apparent edema of the terminal ileum with probable adjacent abscess most consistent with Crohn's disease with associated abscess formation. 2. The appendix is well seen and is normal very low in the right bony pelvis.   Electronically Signed   By: Dwyane Dee M.D.   On: 12/12/2013 12:58    Micro Results      No results found for this or any previous visit (from the past 240 hour(s)).     Today   Subjective:   Nathaniel West today has no headache,no chest abdominal pain,no new weakness tingling or numbness, feels much better wants to go home today.    Objective:   Blood pressure 112/61, pulse 64, temperature 98.6 F (37 C), temperature source Oral, resp. rate 18, height 6\' 1"  (1.854 m), weight 77.111 kg (170 lb), SpO2 97 %.  Intake/Output Summary (Last 24 hours) at 12/15/13 1059 Last data filed at 12/14/13 2050  Gross per 24 hour  Intake    772 ml  Output      1 ml  Net    771 ml    Exam  Awake Alert, Oriented x 3, No new F.N deficits, Normal affect [place].AT,PERRAL Supple Neck,No JVD, No cervical lymphadenopathy appriciated.   Symmetrical Chest wall movement, Good air movement bilaterally, CTAB RRR,No Gallops,Rubs or new Murmurs, No Parasternal Heave +ve B.Sounds, Abd Soft, Non tender, No organomegaly appriciated, No rebound -guarding or rigidity. No Cyanosis, Clubbing or edema, No new Rash or bruise  Data Review   CBC w Diff:  Lab Results  Component Value Date   WBC 7.2 12/15/2013   HGB 13.6 12/15/2013   HCT 41.8 12/15/2013   PLT 290 12/15/2013   LYMPHOPCT 23 12/12/2013   MONOPCT 6 12/12/2013   EOSPCT 1 12/12/2013   BASOPCT 0 12/12/2013    CMP:  Lab Results  Component Value Date   NA 143 12/14/2013   K 4.0 12/14/2013   CL 104 12/14/2013   CO2 25 12/14/2013   BUN 9 12/14/2013   CREATININE 1.03 12/14/2013   PROT 6.6 12/13/2013   ALBUMIN 3.1* 12/13/2013   BILITOT 0.5 12/13/2013   ALKPHOS 66 12/13/2013   AST 15 12/13/2013   ALT 12 12/13/2013  .   Total Time in preparing paper work, data evaluation and todays exam - 35 minutes  Leroy Sea M.D on 12/15/2013 at 10:59 AM  [facility] Office  [phone]

## 2013-12-15 NOTE — Discharge Instructions (Signed)
Full Liquid Diet A full liquid diet may be used:   To help you transition from a clear liquid diet to a soft diet.   When your body is healing and can only tolerate foods that are easy to digest.  Before or after certain a procedure, test, or surgery (such as stomach or intestinal surgeries).   If you have trouble swallowing or chewing.  A full liquid diet includes fluids and foods that are liquid or will become liquid at room temperature. The full liquid diet gives you the proteins, fluids, salts, and minerals that you need for energy. If you continue this diet for more than 72 hours, talk to your health care provider about how many calories you need to consume. If you continue the diet for more than 5 days, talk to your health care provider about taking a multivitamin or a nutritional supplement. WHAT DO I NEED TO KNOW ABOUT A FULL LIQUID DIET?  You may have any liquid.  You may have any food that becomes a liquid at room temperature. The food is considered a liquid if it can be poured off a spoon at room temperature.  Drink one serving of citrus or vitamin C-enriched fruit juice daily. WHAT FOODS CAN I EAT? Grains Any grain food that can be pureed in soup (such as crackers, pasta, and rice). Hot cereal (such as farina or oatmeal) that has been blended. Talk to your health care provider or dietitian about these foods. Vegetables Pulp-free tomato or vegetable juice. Vegetables pureed in soup.  Fruits Fruit juice, including nectars and juices with pulp. Meats and Other Protein Sources Eggs in custard, eggnog mix, and eggs used in ice cream or pudding. Strained meats, like in baby food, may be allowed. Consult your health care provider.  Dairy Milk and milk-based beverages, including milk shakes and instant breakfast mixes. Smooth yogurt. Pureed cottage cheese. Avoid these foods if they are not well tolerated. Beverages All beverages, including liquid nutritional supplements. Ask  your health care provider if you can have carbonated beverages. They may not be well tolerated. Condiments Iodized salt, pepper, spices, and flavorings. Cocoa powder. Vinegar, ketchup, yellow mustard, smooth sauces (such as hollandaise, cheese sauce, or white sauce), and soy sauce. Sweets and Desserts Custard, smooth pudding. Flavored gelatin. Tapioca, junket. Plain ice cream, sherbet, fruit ices. Frozen ice pops, frozen fudge pops, pudding pops, and other frozen bars with cream. Syrups, including chocolate syrup. Sugar, honey, jelly.  Fats and Oils Margarine, butter, cream, sour cream, and oils. Other Broth and cream soups. Strained, broth-based soups. The items listed above may not be a complete list of recommended foods or beverages. Contact your dietitian for more options.  WHAT FOODS CAN I NOT EAT? Grains All breads. Grains are not allowed unless they are pureed into soup. Vegetables Vegetables are not allowed unless they are juiced, or cooked and pureed into soup. Fruits Fruits are not allowed unless they are juiced. Meats and Other Protein Sources Any meat or fish. Cooked or raw eggs. Nut butters.  Dairy Cheese.  Condiments Stone ground mustards. Fats and Oils Fats that are coarse or chunky. Sweets and Desserts Ice cream or other frozen desserts that have any solids in them or on top, such as nuts, chocolate chips, and pieces of cookies. Cakes. Cookies. Candy. Others Soups with chunks or pieces in them. The items listed above may not be a complete list of foods and beverages to avoid. Contact your dietitian for more information. Document Released: 01/10/2005 Document  Revised: 01/15/2013 Document Reviewed: 11/15/2012 [facility] Patient Information 2015 [place], [place]. This information is not intended to replace advice given to you by your health care provider. Make sure you discuss any questions you have with your health care provider.  Low-Fiber Diet Fiber is found in fruits,  vegetables, and whole grains. A low-fiber diet restricts fibrous foods that are not digested in the small intestine. A diet containing about 10-15 grams of fiber per day is considered low fiber. Low-fiber diets may be used to:  Promote healing and rest the bowel during intestinal flare-ups.  Prevent blockage of a partially obstructed or narrowed gastrointestinal tract.  Reduce fecal weight and volume.  Slow the movement of feces. You may be on a low-fiber diet as a transitional diet following surgery, after an injury (trauma), or because of a short (acute) or lifelong (chronic) illness. Your health care provider will determine the length of time you need to stay on this diet.  WHAT DO I NEED TO KNOW ABOUT A LOW-FIBER DIET? Always check the fiber content on the packaging's Nutrition Facts label, especially on foods from the grains list. Ask your dietitian if you have questions about specific foods that are related to your condition, especially if the food is not listed below. In general, a low-fiber food will have less than 2 g of fiber. WHAT FOODS CAN I EAT? Grains All breads and crackers made with white flour. Sweet rolls, doughnuts, waffles, pancakes, [place] toast, bagels. Pretzels, Melba toast, zwieback. Well-cooked cereals, such as cornmeal, farina, or cream cereals. Dry cereals that do not contain whole grains, fruit, or nuts, such as refined corn, wheat, rice, and oat cereals. Potatoes prepared any way without skins, plain pastas and noodles, refined white rice. Use white flour for baking and making sauces. Use allowed list of grains for casseroles, dumplings, and puddings.  Vegetables Strained tomato and vegetable juices. Fresh lettuce, cucumber, spinach. Well-cooked (no skin or pulp) or canned vegetables, such as asparagus, bean sprouts, beets, carrots, green beans, mushrooms, potatoes, pumpkin, spinach, yellow squash, tomato sauce/puree, turnips, yams, and zucchini. Keep servings limited to   cup.  Fruits All fruit juices except prune juice. Cooked or canned fruits without skin and seeds, such as applesauce, apricots, cherries, fruit cocktail, grapefruit, grapes, mandarin oranges, melons, peaches, pears, pineapple, and plums. Fresh fruits without skin, such as apricots, avocados, bananas, melons, pineapple, nectarines, and peaches. Keep servings limited to  cup or 1 piece.  Meat and Other Protein Sources Ground or well-cooked tender beef, ham, veal, lamb, pork, or poultry. Eggs, plain cheese. Fish, oysters, shrimp, lobster, and other seafood. Liver, organ meats. Smooth nut butters. Dairy All milk products and alternative dairy substitutes, such as soy, rice, almond, and coconut, not containing added whole nuts, seeds, or added fruit. Beverages Decaf coffee, fruit, and vegetable juices or smoothies (small amounts, with no pulp or skins, and with fruits from allowed list), sports drinks, herbal tea. Condiments Ketchup, mustard, vinegar, cream sauce, cheese sauce, cocoa powder. Spices in moderation, such as allspice, basil, bay leaves, celery powder or leaves, cinnamon, cumin powder, curry powder, ginger, mace, marjoram, onion or garlic powder, oregano, paprika, parsley flakes, ground pepper, rosemary, sage, savory, tarragon, thyme, and turmeric. Sweets and Desserts Plain cakes and cookies, pie made with allowed fruit, pudding, custard, cream pie. Gelatin, fruit, ice, sherbet, frozen ice pops. Ice cream, ice milk without nuts. Plain hard candy, honey, jelly, molasses, syrup, sugar, chocolate syrup, gumdrops, marshmallows. Limit overall sugar intake.  Fats and Oil Margarine,  butter, cream, mayonnaise, salad oils, plain salad dressings made from allowed foods. Choose healthy fats such as olive oil, canola oil, and omega-3 fatty acids (such as found in salmon or tuna) when possible.  Other Bouillon, broth, or cream soups made from allowed foods. Any strained soup. Casseroles or mixed dishes  made with allowed foods. The items listed above may not be a complete list of recommended foods or beverages. Contact your dietitian for more options.  WHAT FOODS ARE NOT RECOMMENDED? Grains All whole wheat and whole grain breads and crackers. Multigrains, rye, bran seeds, nuts, or coconut. Cereals containing whole grains, multigrains, bran, coconut, nuts, raisins. Cooked or dry oatmeal, steel-cut oats. Coarse wheat cereals, granola. Cereals advertised as high fiber. Potato skins. Whole grain pasta, wild or brown rice. Popcorn. Coconut flour. Bran, buckwheat, corn bread, multigrains, rye, wheat germ.  Vegetables Fresh, cooked or canned vegetables, such as artichokes, asparagus, beet greens, broccoli, Brussels sprouts, cabbage, celery, cauliflower, corn, eggplant, kale, legumes or beans, okra, peas, and tomatoes. Avoid large servings of any vegetables, especially raw vegetables.  Fruits Fresh fruits, such as apples with or without skin, berries, cherries, figs, grapes, grapefruit, guavas, kiwis, mangoes, oranges, papayas, pears, persimmons, pineapple, and pomegranate. Prune juice and juices with pulp, stewed or dried prunes. Dried fruits, dates, raisins. Fruit seeds or skins. Avoid large servings of all fresh fruits. Meats and Other Protein Sources Tough, fibrous meats with gristle. Chunky nut butter. Cheese made with seeds, nuts, or other foods not recommended. Nuts, seeds, legumes (beans, including baked beans), dried peas, beans, lentils.  Dairy Yogurt or cheese that contains nuts, seeds, or added fruit.  Beverages Fruit juices with high pulp, prune juice. Caffeinated coffee and teas.  Condiments Coconut, maple syrup, pickles, olives. Sweets and Desserts Desserts, cookies, or candies that contain nuts or coconut, chunky peanut butter, dried fruits. Jams, preserves with seeds, marmalade. Large amounts of sugar and sweets. Any other dessert made with fruits from the not recommended list.   Other Soups made from vegetables that are not recommended or that contain other foods not recommended.  The items listed above may not be a complete list of foods and beverages to avoid. Contact your dietitian for more information. Document Released: 07/02/2001 Document Revised: 01/15/2013 Document Reviewed: 12/03/2012 [facility] Patient Information 2015 [place]. This information is not intended to replace advice given to you by your health care provider. Make sure you discuss any questions you have with your health care provider.   Follow with Primary MD No primary care provider on file. in 7 days   Get CBC, CMP, 2 view Chest X ray checked  by Primary MD next visit.    Activity: As tolerated with Full fall precautions use walker/cane & assistance as needed   Disposition Home     Diet: As Above   On your next visit with your primary care physician please Get Medicines reviewed and adjusted.   Please request your Prim.MD to go over all Hospital Tests and Procedure/Radiological results at the follow up, please get all Hospital records sent to your Prim MD by signing hospital release before you go home.   If you experience worsening of your admission symptoms, develop shortness of breath, life threatening emergency, suicidal or homicidal thoughts you must seek medical attention immediately by calling 911 or calling your MD immediately  if symptoms less severe.  You Must read complete instructions/literature along with all the possible adverse reactions/side effects for all the Medicines you take and that have  been prescribed to you. Take any new Medicines after you have completely understood and accpet all the possible adverse reactions/side effects.   Do not drive, operating heavy machinery, perform activities at heights, swimming or participation in water activities or provide baby sitting services if your were admitted for syncope or siezures until you have seen by Primary MD  or a Neurologist and advised to do so again.  Do not drive when taking Pain medications.    Do not take more than prescribed Pain, Sleep and Anxiety Medications  Special Instructions: If you have smoked or chewed Tobacco  in the last 2 yrs please stop smoking, stop any regular Alcohol  and or any Recreational drug use.  Wear Seat belts while driving.   Please note  You were cared for by a hospitalist during your hospital stay. If you have any questions about your discharge medications or the care you received while you were in the hospital after you are discharged, you can call the unit and asked to speak with the hospitalist on call if the hospitalist that took care of you is not available. Once you are discharged, your primary care physician will handle any further medical issues. Please note that NO REFILLS for any discharge medications will be authorized once you are discharged, as it is imperative that you return to your primary care physician (or establish a relationship with a primary care physician if you do not have one) for your aftercare needs so that they can reassess your need for medications and monitor your lab values.

## 2013-12-15 NOTE — Progress Notes (Signed)
Subjective  No complaints, tolerating soft diet   Objective  Feeling fine, no abd. Pain, IV Zosyn for pelvic abcess, rescaning this morning shows impeovement in the abcess size , but still not drainable, TI still thickened, no bowl dilation. Vital signs in last 24 hours: Temp:  [98 F (36.7 C)-98.6 F (37 C)] 98.6 F (37 C) (11/22 0547) Pulse Rate:  [62-64] 64 (11/22 0547) Resp:  [18-20] 18 (11/22 0547) BP: (108-112)/(61-69) 112/61 mmHg (11/22 0547) SpO2:  [97 %-100 %] 97 % (11/22 0547) Last BM Date: 12/14/13 General:    white male in NAD Heart:  Regular rate and rhythm; no murmurs Lungs: Respirations even and unlabored, lungs CTA bilaterally Abdomen:  Minimal tenderness RLQ on deep palpation. Normal bowel sounds. Extremities:  Without edema. Neurologic:  Alert and oriented,  grossly normal neurologically. Psych:  Cooperative. Normal mood and affect.  Intake/Output from previous day: 11/21 0701 - 11/22 0700 In: 1132 [P.O.:582; I.V.:450; IV Piggyback:100] Out: 1 [Urine:1] Intake/Output this shift:    Lab Results:  Recent Labs  12/13/13 0527 12/14/13 0708 12/15/13 0532  WBC 10.6* 8.4 7.2  HGB 13.5 12.9* 13.6  HCT 41.1 40.3 41.8  PLT 231 242 290   BMET  Recent Labs  12/12/13 1520 12/12/13 1725 12/13/13 0527 12/14/13 0708  NA 140  --  143 143  K 4.4  --  4.2 4.0  CL 98  --  103 104  CO2 30  --  28 25  GLUCOSE 147*  --  78 84  BUN 13  --  14 9  CREATININE 0.93 0.91 1.15 1.03  CALCIUM 9.4  --  9.1 8.9   LFT  Recent Labs  12/12/13 1725 12/13/13 0527  PROT 7.5 6.6  ALBUMIN 3.4* 3.1*  AST 18 15  ALT 14 12  ALKPHOS 71 66  BILITOT 0.4 0.5  BILIDIR <0.2  --   IBILI NOT CALCULATED  --    PT/INR No results for input(s): LABPROT, INR in the last 72 hours.  Studies/Results: Ct Pelvis W Contrast  12/15/2013   CLINICAL DATA:  Pelvic abscess, followup  EXAM: CT PELVIS WITH CONTRAST  TECHNIQUE: Multidetector CT imaging of the pelvis was performed using  the standard protocol following the bolus administration of intravenous contrast. Sagittal and coronal MPR images reconstructed from axial data set.  CONTRAST:  100mL OMNIPAQUE IOHEXOL 300 MG/ML SOLN IV. Dilute oral contrast.  COMPARISON:  12/12/2013  FINDINGS: Abscess collection in RIGHT pelvis adjacent to terminal ileum compatible with Crohn's disease.  Collection demonstrates slightly increased thickening of the wall with decreased fluid component and decreased overall size, now measuring 3.2 x 3.1 cm, previously 3.9 x 3.5 cm.  Persistent thickening of terminal ileal wall with scattered reactive lymph nodes.  Minimal free pelvic fluid.  No new pelvic abscess collections identified.  Remaining pelvic bowel loops unremarkable.  Normal appendix.  Normal appearing bladder and ureters.  No mass, hernia or acute bone lesion.  IMPRESSION: Slight decrease in size of abscess collection identified adjacent to the terminal ileum.  Persistent wall thickening of the terminal ileum consistent with enteritis, question Crohns disease.  Minimal free pelvic fluid and scattered reactive lymph nodes noted.   Electronically Signed   By: Ulyses SouthwardMark  Boles M.D.   On: 12/15/2013 10:09       Assessment / Plan:   Pelvic abscess likely secondary to Crohn's disease, responding to IV Zosyn,  Switch to oral Flagyl  500mg  tid and Cipro 500mg  po bid x 2  weeks [facility] GI office in 2 weeks to see PA9 We will call him on Monday) Low residue diet discussed Will need surgical follow up ( spoke to [employer]Kelly Osborne PA) Plan reimaging in  2 weeks or lated depending on how he does  Active Problems:   Intra-abdominal abscess     LOS: 3 days   Lina SarDora Emmons Toth  12/15/2013, 10:46 AM

## 2013-12-15 NOTE — Progress Notes (Signed)
Nsg Discharge Note  Admit Date:  12/12/2013 Discharge date: 12/15/2013   Nathaniel West to be D/C'd Home per MD order.  AVS completed.  Copy for chart, and copy for patient signed, and dated. Patient/caregiver able to verbalize understanding.  Discharge Medication:   Medication List    STOP taking these medications        ibuprofen 200 MG tablet  Commonly known as:  ADVIL,MOTRIN      TAKE these medications        ciprofloxacin 500 MG tablet  Commonly known as:  CIPRO  Take 1 tablet (500 mg total) by mouth 2 (two) times daily.     HYDROcodone-acetaminophen 5-325 MG per tablet  Commonly known as:  NORCO/VICODIN  Take 1 tablet by mouth every 6 (six) hours as needed for moderate pain.     hyoscyamine 0.125 MG tablet  Commonly known as:  LEVSIN, ANASPAZ  Take 0.125 mg by mouth as needed for bladder spasms.     metroNIDAZOLE 500 MG tablet  Commonly known as:  FLAGYL  Take 1 tablet (500 mg total) by mouth 3 (three) times daily.        Discharge Assessment: Filed Vitals:   12/15/13 0547  BP: 112/61  Pulse: 64  Temp: 98.6 F (37 C)  Resp: 18   Skin clean, dry and intact without evidence of skin break down, no evidence of skin tears noted. IV catheter discontinued intact. Site without signs and symptoms of complications - no redness or edema noted at insertion site, patient denies c/o pain - only slight tenderness at site.  Dressing with slight pressure applied.  D/c Instructions-Education: Discharge instructions given to patient/family with verbalized understanding. D/c education completed with patient/family including follow up instructions, medication list, d/c activities limitations if indicated, with other d/c instructions as indicated by MD - patient able to verbalize understanding, all questions fully answered. Patient instructed to return to ED, call 911, or call MD for any changes in condition.  Patient escorted via WC, and D/C home via private auto.  Nathaniel West,  Nathaniel West Nathaniel Loselaine, RN 12/15/2013 1:04 PM

## 2013-12-18 ENCOUNTER — Other Ambulatory Visit (INDEPENDENT_AMBULATORY_CARE_PROVIDER_SITE_OTHER): Payer: Self-pay | Admitting: Surgery

## 2013-12-18 DIAGNOSIS — K50919 Crohn's disease, unspecified, with unspecified complications: Secondary | ICD-10-CM

## 2013-12-30 ENCOUNTER — Ambulatory Visit
Admission: RE | Admit: 2013-12-30 | Discharge: 2013-12-30 | Disposition: A | Discharge status: Home / Self Care | Payer: BC Managed Care – PPO | Source: Ambulatory Visit | Attending: Surgery | Admitting: Surgery

## 2013-12-30 DIAGNOSIS — K50919 Crohn's disease, unspecified, with unspecified complications: Secondary | ICD-10-CM

## 2013-12-30 MED ORDER — IOHEXOL 300 MG/ML  SOLN
100.0000 mL | Freq: Once | INTRAMUSCULAR | Status: AC | PRN
  Administered 2013-12-30: 100 mL via INTRAVENOUS

## 2013-12-31 ENCOUNTER — Telehealth (INDEPENDENT_AMBULATORY_CARE_PROVIDER_SITE_OTHER): Payer: Self-pay

## 2013-12-31 NOTE — Telephone Encounter (Signed)
Pt calling for CT results.  He has an appt with [facility] on 01/15/14.  Does Dr. Providence Crosbysu

## 2014-01-13 ENCOUNTER — Encounter: Payer: Self-pay | Admitting: *Deleted

## 2014-01-14 ENCOUNTER — Encounter: Payer: Self-pay | Admitting: Nurse Practitioner

## 2014-01-14 ENCOUNTER — Ambulatory Visit (INDEPENDENT_AMBULATORY_CARE_PROVIDER_SITE_OTHER): Payer: BC Managed Care – PPO | Admitting: Nurse Practitioner

## 2014-01-14 VITALS — BP 110/74 | HR 64 | Ht 72.25 in | Wt 170.2 lb

## 2014-01-14 DIAGNOSIS — IMO0002 Reserved for concepts with insufficient information to code with codable children: Secondary | ICD-10-CM

## 2014-01-14 DIAGNOSIS — K651 Peritoneal abscess: Secondary | ICD-10-CM

## 2014-01-14 DIAGNOSIS — K529 Noninfective gastroenteritis and colitis, unspecified: Secondary | ICD-10-CM

## 2014-01-14 MED ORDER — MOVIPREP 100 G PO SOLR: 1.0000 | Freq: Once | ORAL | Status: DC

## 2014-01-14 NOTE — Progress Notes (Signed)
History of Present Illness:   Nathaniel West is a 38 year old male who we recently met during a hospitalization for abdominal pain / abdominal abscess. CT scan revealed right lower quadrant abscess adjacent to the terminal ileum where there was wall thickening present. Etiology was unclear. Patient has no history of Crohn's disease, onset of symptoms was sudden. Abscess was not drainable per interventional radiology. Patient was evaluated by surgery who recommended treating for possible small diverticular abscess. Follow up CTscan after a few days of Zosyn showed improvement in the abscess size but persistent terminal ileal thickening without bowel dilation. Patient's pain rapidly improved, he was discharged home on Cipro and Flagyl. Surgery ordered repeat CT scan which was done 12/30/13 and revealed significant improvement in abscess collection, now measuring 2.2 times 1.5 cm in size. Mesenteric stranding had improved and it was only residual terminal ileal thickening.   Patient comes in today for hospital follow-up. He continues to feel great. Today is his last day of Cipro and Flagyl. No fevers. Bowels are moving well. Appetite is good. He sees surgery early next month.  Current Medications, Allergies, Past Medical History, Past Surgical History, Family History and Social History were reviewed in [employer] record.  Studies:   Ct Abdomen Pelvis W Contrast  12/30/2013   CLINICAL DATA:  Follow-up abscess adjacent to terminal ileum, Crohn's disease, finished oral antibiotics yesterday  EXAM: CT ABDOMEN AND PELVIS WITH CONTRAST  TECHNIQUE: Multidetector CT imaging of the abdomen and pelvis was performed using the standard protocol following bolus administration of intravenous contrast.  CONTRAST:  141m OMNIPAQUE IOHEXOL 300 MG/ML  SOLN  COMPARISON:  12/15/2013 and 12/12/2013  FINDINGS: Lung bases are unremarkable. Sagittal images of the spine are unremarkable. Stable mild disc space  flattening at L5-S1 level.  Liver, spleen, pancreas and adrenal glands are unremarkable. Moderate stool and contrast noted throughout the colon.  No small bowel obstruction.  No ascites or free air.  No aortic aneurysm.  There is significant improvement in abscess collection just above the terminal ileum. Measures 2.2 x 1.5 cm. On the prior exam measures 3.3 x 3.1 cm. Persistent mild adjacent mesenteric stranding with improvement from prior exam. No new abscess is noted. No extraluminal oral contrast material. Moderate stool noted within rectosigmoid colon. Prostate gland and seminal vesicles are unremarkable.  No destructive bony lesions are noted within pelvis. Residual mild thickening of terminal ileal wall with improvement from prior exam. Small lymph nodes in right lower quadrant mesentery. Normal appendix clearly visualized in axial image 70.  IMPRESSION: 1. There is continuous improvement with decreased size of abscess collection just above the terminal ileum Measures now 2.2 x 1.5 cm. On the prior exam measures 3.3 x 3.1 cm. Only mild residual adjacent mesenteric inflammatory changes with improvement. Residual mild thickening of terminal ileal wall with improvement. No extraluminal contrast material. Persistent small reactive mesenteric lymph nodes in right lower quadrant. 2. No new abscess is noted. 3. No hydronephrosis or hydroureter. 4. Moderate contrast and stool throughout the colon. 5. Normal appendix.   Electronically Signed   By: LLahoma CrockerM.D.   On: 12/30/2013 14:55    Physical Exam: General: Pleasant, well developed , white male in no acute distress Head: Normocephalic and atraumatic Eyes:  sclerae anicteric, conjunctiva pink  Ears: Normal auditory acuity Lungs: Clear throughout to auscultation Heart: Regular rate and rhythm Abdomen: Soft, non distended, non-tender. No masses, no hepatomegaly. Normal bowel sounds Musculoskeletal: Symmetrical with no gross deformities  Extremities: No  edema  Neurological: Alert oriented x 4, grossly nonfocal Psychological:  Alert and cooperative. Normal mood and affect  Assessment and Recommendations:   38 year old male recently hospitalized with lower abdominal pain and CT scan revealing terminal ileal thickening with adjacent abscess (not amenable to drainage).  Follow-up CT scan 12/30/13 shows significant improvement in size of fluid collection as well as terminal ileal thickening and patient actually feels great. Etiology still unclear at this point. Patient completes a prolonged course of Cipro and Flagyl today. He sees surgery next month.  Will proceed with colonoscopy (in a few weeks) to evaluate for Crohn's disease. The risks, benefits, and alternatives to colonoscopy with possible biopsy and possible polypectomy were discussed with the patient and he consents to proceed.

## 2014-01-14 NOTE — Patient Instructions (Signed)
You have been scheduled for a colonoscopy. Please follow written instructions given to you at your visit today.  Please pick up your prep kit at the pharmacy within the next 1-3 days. If you use inhalers (even only as needed), please bring them with you on the day of your procedure. Your physician has requested that you go to www.startemmi.com and enter the access code given to you at your visit today. This web site gives a general overview about your procedure. However, you should still follow specific instructions given to you by our office regarding your preparation for the procedure.

## 2014-02-12 ENCOUNTER — Encounter: Payer: Self-pay | Admitting: Internal Medicine

## 2014-02-12 ENCOUNTER — Ambulatory Visit (AMBULATORY_SURGERY_CENTER): Payer: BLUE CROSS/BLUE SHIELD | Admitting: Internal Medicine

## 2014-02-12 VITALS — BP 108/65 | HR 61 | Temp 96.4°F | Resp 12 | Ht 72.0 in | Wt 170.0 lb

## 2014-02-12 DIAGNOSIS — IMO0002 Reserved for concepts with insufficient information to code with codable children: Secondary | ICD-10-CM

## 2014-02-12 DIAGNOSIS — K5730 Diverticulosis of large intestine without perforation or abscess without bleeding: Secondary | ICD-10-CM

## 2014-02-12 DIAGNOSIS — K651 Peritoneal abscess: Secondary | ICD-10-CM

## 2014-02-12 MED ORDER — SODIUM CHLORIDE 0.9 % IV SOLN: 500.0000 mL | INTRAVENOUS | Status: DC

## 2014-02-12 NOTE — Op Note (Signed)
[facility] [address] [place], [id number]   COLONOSCOPY PROCEDURE REPORT  PATIENT: Nathaniel West, Nathaniel West  MR#: [id number] BIRTHDATE: [birth year] , 38  yrs. old GENDER: male ENDOSCOPIST: Hart Carwinora M Kiptyn Rafuse, MD REFERRED IO:NGEXBY:Dean Clovis RileyMitchell, M.D. PROCEDURE DATE:  02/12/2014 PROCEDURE:   Colonoscopy with biopsy First Screening Colonoscopy - Avg.  risk and is 50 yrs.  old or older Yes.  Prior Negative Screening - Now for repeat screening. N/A  History of Adenoma - Now for follow-up colonoscopy & has been > or = to 3 yrs.  N/A  Polyps Removed Today? No.  Polyps Removed Today? No.  Recommend repeat exam, <10 yrs? Polyps Removed Today? No.  Recommend repeat exam, <10 yrs? No. ASA CLASS:   Class I INDICATIONS:intra-abdominal abscess adjacent to terminal ileum in November 2015 not accessible to percutaneous drainage.  Responded to intravenous antibiotics.  Question of Crohn's disease.  Patient is currently asymptomatic. MEDICATIONS: Monitored anesthesia care and Propofol 250 mg IV  DESCRIPTION OF PROCEDURE:   After the risks benefits and alternatives of the procedure were thoroughly explained, informed consent was obtained.  The digital rectal exam revealed no abnormalities of the rectum.   The LB [id number]  endoscope was introduced through the anus and advanced to the terminal ileum which was intubated for a short distance. No adverse events experienced.   The quality of the prep was excellent, using MoviPrep  The instrument was then slowly withdrawn as the colon was fully examined.      COLON FINDINGS: A normal appearing cecum, ileocecal valve, and appendiceal orifice were identified.  The ascending, transverse, descending, sigmoid colon, and rectum appeared unremarkable. There was a one single diverticulum adjacent to the ieo- cecal valve .     Retroflexed views revealed no abnormalities. The time to cecum=4 minutes 40 seconds.  Withdrawal time=6 minutes 20 seconds.   The scope was withdrawn and the procedure completed. COMPLICATIONS: There were no immediate complications.  ENDOSCOPIC IMPRESSION: Normal colonoscopy to terminal ileum biopsies from terminal ileum taken one single diverticulum at the ileocecal valve possible cause of diverticulitis and abscess Normal terminal ileum without evidence of Crohn's disease  RECOMMENDATIONS: 1.  Await biopsy results 2.  High fiber diet Recall colonoscopy at age 39  eSigned:  Hart Carwinora M Koen Antilla, MD 02/12/2014 3:01 PM   cc:   PATIENT NAME:  Nathaniel West, Nathaniel West MR#: [id number]

## 2014-02-12 NOTE — Progress Notes (Signed)
Called to room to assist during endoscopic procedure.  Patient ID and intended procedure confirmed with present staff. Received instructions for my participation in the procedure from the performing physician.  

## 2014-02-12 NOTE — Patient Instructions (Addendum)
YOU HAD AN ENDOSCOPIC PROCEDURE TODAY AT THE [facility]: Refer to the procedure report that was given to you for any specific questions about what was found during the examination.  If the procedure report does not answer your questions, please call your gastroenterologist to clarify.  If you requested that your care partner not be given the details of your procedure findings, then the procedure report has been included in a sealed envelope for you to review at your convenience later.  YOU SHOULD EXPECT: Some feelings of bloating in the abdomen. Passage of more gas than usual.  Walking can help get rid of the air that was put into your GI tract during the procedure and reduce the bloating. If you had a lower endoscopy (such as a colonoscopy or flexible sigmoidoscopy) you may notice spotting of blood in your stool or on the toilet paper. If you underwent a bowel prep for your procedure, then you may not have a normal bowel movement for a few days.  DIET: Your first meal following the procedure should be a light meal and then it is ok to progress to your normal diet.  A half-sandwich or bowl of soup is an example of a good first meal.  Heavy or fried foods are harder to digest and may make you feel nauseous or bloated.  Likewise meals heavy in dairy and vegetables can cause extra gas to form and this can also increase the bloating.  Drink plenty of fluids but you should avoid alcoholic beverages for 24 hours.  ACTIVITY: Your care partner should take you home directly after the procedure.  You should plan to take it easy, moving slowly for the rest of the day.  You can resume normal activity the day after the procedure however you should NOT DRIVE or use heavy machinery for 24 hours (because of the sedation medicines used during the test).    SYMPTOMS TO REPORT IMMEDIATELY: A gastroenterologist can be reached at any hour.  During normal business hours, 8:30 AM to 5:00 PM Monday through Friday,  call [phone].  After hours and on weekends, please call the GI answering service at [phone] who will take a message and have the physician on call contact you.   Following lower endoscopy (colonoscopy or flexible sigmoidoscopy):  Excessive amounts of blood in the stool  Significant tenderness or worsening of abdominal pains  Swelling of the abdomen that is new, acute  Fever of 100F or higher   FOLLOW UP: If any biopsies were taken you will be contacted by phone or by letter within the next 1-3 weeks.  Call your gastroenterologist if you have not heard about the biopsies in 3 weeks.  Our staff will call the home number listed on your records the next business day following your procedure to check on you and address any questions or concerns that you may have at that time regarding the information given to you following your procedure. This is a courtesy call and so if there is no answer at the home number and we have not heard from you through the emergency physician on call, we will assume that you have returned to your regular daily activities without incident.  SIGNATURES/CONFIDENTIALITY: You and/or your care partner have signed paperwork which will be entered into your electronic medical record.  These signatures attest to the fact that that the information above on your After Visit Summary has been reviewed and is understood.  Full responsibility of the confidentiality of   this discharge information lies with you and/or your care-partner.   Resume medications. Information given on high fiber diet with discharge instructions. 

## 2014-02-12 NOTE — Progress Notes (Signed)
A/ox3, pleased with MAC, report to RN 

## 2014-02-13 ENCOUNTER — Telehealth: Payer: Self-pay

## 2014-02-13 NOTE — Telephone Encounter (Signed)
Left message on answering machine. 

## 2014-02-19 ENCOUNTER — Encounter: Payer: Self-pay | Admitting: Internal Medicine

## 2014-07-13 ENCOUNTER — Encounter (HOSPITAL_COMMUNITY): Payer: Self-pay | Admitting: Emergency Medicine

## 2014-07-13 ENCOUNTER — Emergency Department (HOSPITAL_COMMUNITY)
Admission: EM | Admit: 2014-07-13 | Discharge: 2014-07-13 | Disposition: A | Discharge status: Home / Self Care | Payer: BLUE CROSS/BLUE SHIELD | Attending: Emergency Medicine | Admitting: Emergency Medicine

## 2014-07-13 ENCOUNTER — Emergency Department (HOSPITAL_COMMUNITY): Payer: BLUE CROSS/BLUE SHIELD

## 2014-07-13 DIAGNOSIS — R1031 Right lower quadrant pain: Secondary | ICD-10-CM

## 2014-07-13 DIAGNOSIS — Z79899 Other long term (current) drug therapy: Secondary | ICD-10-CM | POA: Diagnosis not present

## 2014-07-13 DIAGNOSIS — Z87891 Personal history of nicotine dependence: Secondary | ICD-10-CM | POA: Diagnosis not present

## 2014-07-13 DIAGNOSIS — Z8719 Personal history of other diseases of the digestive system: Secondary | ICD-10-CM | POA: Insufficient documentation

## 2014-07-13 LAB — CBC WITH DIFFERENTIAL/PLATELET
Basophils Absolute: 0 10*3/uL (ref 0.0–0.1)
Basophils Relative: 0 % (ref 0–1)
Eosinophils Absolute: 0 10*3/uL (ref 0.0–0.7)
Eosinophils Relative: 0 % (ref 0–5)
HEMATOCRIT: 45.5 % (ref 39.0–52.0)
Hemoglobin: 15.2 g/dL (ref 13.0–17.0)
Lymphocytes Relative: 22 % (ref 12–46)
Lymphs Abs: 2.1 10*3/uL (ref 0.7–4.0)
MCH: 30.1 pg (ref 26.0–34.0)
MCHC: 33.4 g/dL (ref 30.0–36.0)
MCV: 90.1 fL (ref 78.0–100.0)
MONO ABS: 0.6 10*3/uL (ref 0.1–1.0)
Monocytes Relative: 6 % (ref 3–12)
NEUTROS ABS: 6.6 10*3/uL (ref 1.7–7.7)
Neutrophils Relative %: 72 % (ref 43–77)
Platelets: 170 10*3/uL (ref 150–400)
RBC: 5.05 MIL/uL (ref 4.22–5.81)
RDW: 13.8 % (ref 11.5–15.5)
WBC: 9.3 10*3/uL (ref 4.0–10.5)

## 2014-07-13 LAB — COMPREHENSIVE METABOLIC PANEL
ALBUMIN: 3.9 g/dL (ref 3.5–5.0)
ALT: 19 U/L (ref 17–63)
AST: 22 U/L (ref 15–41)
Alkaline Phosphatase: 66 U/L (ref 38–126)
Anion gap: 7 (ref 5–15)
BILIRUBIN TOTAL: 0.8 mg/dL (ref 0.3–1.2)
BUN: 13 mg/dL (ref 6–20)
CALCIUM: 9.2 mg/dL (ref 8.9–10.3)
CHLORIDE: 101 mmol/L (ref 101–111)
CO2: 30 mmol/L (ref 22–32)
Creatinine, Ser: 1.02 mg/dL (ref 0.61–1.24)
GFR calc Af Amer: >60 mL/min (ref >60)
GFR calc non Af Amer: >60 mL/min (ref >60)
Glucose, Bld: 86 mg/dL (ref 65–99)
Potassium: 4.2 mmol/L (ref 3.5–5.1)
Sodium: 138 mmol/L (ref 135–145)
Total Protein: 7.2 g/dL (ref 6.5–8.1)

## 2014-07-13 MED ORDER — IOHEXOL 300 MG/ML  SOLN
100.0000 mL | Freq: Once | INTRAMUSCULAR | Status: AC | PRN
  Administered 2014-07-13: 100 mL via INTRAVENOUS

## 2014-07-13 NOTE — ED Notes (Signed)
Pt c/o abdominal pain without any other symptoms x couple days. Pt also reports that he has a cold x few days.

## 2014-07-13 NOTE — ED Provider Notes (Addendum)
CSN: [id number]     Arrival date & time 07/13/14  1002 History   First MD Initiated Contact with Patient 07/13/14 1203     Chief Complaint  Patient presents with  . Abdominal Pain      HPI Patient presents the emergency department with complaints of right lower quadrant abdominal pain.  He has a history of intra-abdominal abscess which initially was thought to be secondary to inflammatory bowel disease.  This was last year and resolved with IV antibiotics.  He had a colonoscopy after that which demonstrated no signs of inflammatory bowel disease.  He is preparing to leave the country next several days he's developed some new right lower quadrant pain over the past 24-48 hours.  He states normally he would make nothing of this type of pain but he wanted evaluated and looked at prior to leaving the country.  He is concerned about the possibility of recurrence of intra-abdominal abscess.  He denies fevers and chills.  No nausea vomiting.  No urinary complaints.  Denies diarrhea.   Past Medical History  Diagnosis Date  . Intra-abdominal abscess   . Crohn disease    History reviewed. No pertinent past surgical history. No family history on file. History  Substance Use Topics  . Smoking status: Former Smoker    Types: Cigarettes    Quit date: 01/24/1998  . Smokeless tobacco: Never Used  . Alcohol Use: 3.0 oz/week    5 Cans of beer per week    Review of Systems  All other systems reviewed and are negative.     Allergies  Review of patient's allergies indicates no known allergies.  Home Medications   Prior to Admission medications   Medication Sig Start Date End Date Taking? Authorizing Provider  loratadine (CLARITIN) 10 MG tablet Take 10 mg by mouth daily as needed for allergies.   Yes Historical Provider, MD  Multiple Vitamin (MULTIVITAMIN) tablet Take 1 tablet by mouth daily.   Yes Historical Provider, MD   BP 116/65 mmHg  Pulse 61  Temp(Src) 98 F (36.7 C) (Oral)  Resp  16  Ht  (1.854 m)  Wt 172 lb (78.019 kg)  BMI 22.70 kg/m2  SpO2 100% Physical Exam  Constitutional: He is oriented to person, place, and time. He appears well-developed and well-nourished.  HENT:  Head: Normocephalic and atraumatic.  Eyes: EOM are normal.  Neck: Normal range of motion.  Cardiovascular: Normal rate, regular rhythm, normal heart sounds and intact distal pulses.   Pulmonary/Chest: Effort normal and breath sounds normal. No respiratory distress.  Abdominal: Soft. He exhibits no distension.  Mild tenderness in the right lower quadrant without guarding or rebound.  Musculoskeletal: Normal range of motion.  Neurological: He is alert and oriented to person, place, and time.  Skin: Skin is warm and dry.  Psychiatric: He has a normal mood and affect. Judgment normal.  Nursing note and vitals reviewed.   ED Course  Procedures (including critical care time) Labs Review Labs Reviewed  CBC WITH DIFFERENTIAL/PLATELET  COMPREHENSIVE METABOLIC PANEL    Imaging Review Ct Abdomen Pelvis W Contrast  07/13/2014   CLINICAL DATA:  Right lower quadrant pain for several days. History of diverticulitis abscess. History of Crohn's disease.  EXAM: CT ABDOMEN AND PELVIS WITH CONTRAST  TECHNIQUE: Multidetector CT imaging of the abdomen and pelvis was performed using the standard protocol following bolus administration of intravenous contrast.  CONTRAST:  OMNIPAQUE IOHEXOL 300 MG/ML  SOLN  COMPARISON:  CT 12/30/2013, 12/15/2013  FINDINGS: Lower chest: Lung bases are clear.  Hepatobiliary: No focal hepatic lesion. No biliary duct dilatation. Gallbladder is normal. Common bile duct is normal.  Pancreas: Pancreas is normal. No ductal dilatation. No pancreatic inflammation.  Spleen: Normal spleen  Adrenals/urinary tract: Adrenal glands and kidneys are normal. The ureters and bladder normal.  Stomach/Bowel: Stomach, duodenum, jejunum, and ileum are normal. The terminal ileum appears normal  without evidence of inflammation. The previous seen abscess medial to the terminal ileum is no longer present having completed resolved. There is no evidence of stricture, fistula, or abscess associated terminal ileum. The appendix extends inferior to the cecal tip and is normal. The ascending, transverse, descending colon are normal. There is along segment of mild bowel wall thickening enhancement involving the sigmoid colon (image 63 through 67 of series 201). There is no evidence of perforation or abscess. No fistula.  Vascular/Lymphatic: Abdominal aorta is normal caliber. There is no retroperitoneal or periportal lymphadenopathy. No pelvic lymphadenopathy.  Reproductive: Prostate gland is normal.  Musculoskeletal: No aggressive osseous lesion.  Other: No free fluid.  IMPRESSION: 1. Complete resolution of the abscess adjacent to terminal ileum. 2. No evidence of active inflammation of the terminal ileum. No stricture or fistula. 3. Normal appendix. 4. Long segment of very mild bowel wall thickening involving the sigmoid colon. Differential includes physiologic nondistention versus mild inflammatory bowel disease.   Electronically Signed   By: Genevive Bi M.D.   On: 07/13/2014 13:58  I personally reviewed the imaging tests through PACS system I reviewed available ER/hospitalization records through the EMR    EKG Interpretation None      MDM   Final diagnoses:  Right lower quadrant abdominal pain     CT scan negative for acute pathology.  Patient is well-appearing.  Discharge home in good condition.    Azalia Bilis, MD 07/13/14 1646  Azalia Bilis, MD 07/13/14 [phone]

## 2014-07-13 NOTE — Discharge Instructions (Signed)

## 2015-12-05 IMAGING — CT CT PELVIS W/ CM
2 of 3 series · 16 of 46 positions shown, 18 images · IV contrast (omnipaque)
Comparison: 12/12/2013

CLINICAL DATA: Pelvic abscess, followup

EXAM:
CT PELVIS WITH CONTRAST
TECHNIQUE: Multidetector CT imaging of the pelvis was performed using the
standard protocol following the bolus administration of intravenous
contrast. Sagittal and coronal MPR images reconstructed from axial
data set.
CONTRAST:  100mL OMNIPAQUE IOHEXOL 300 MG/ML SOLN IV. Dilute oral
contrast.

[Series 201: routine, idose (2) · axial · 0.65mm/px · z∈[+640,+840]mm · 13 of 48 slices shown, 15 images]
[im 4/48  soft-tissue]
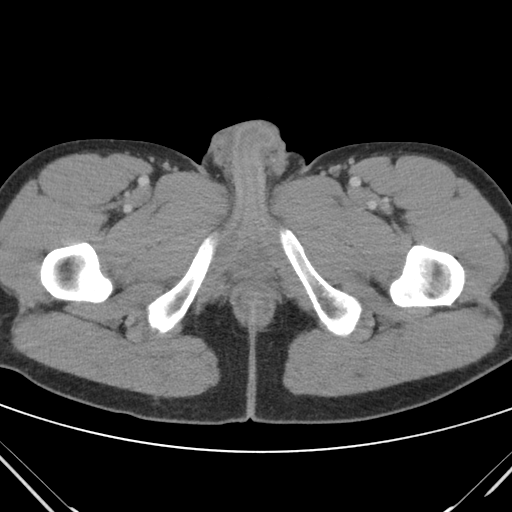
[im 4/48  bone]
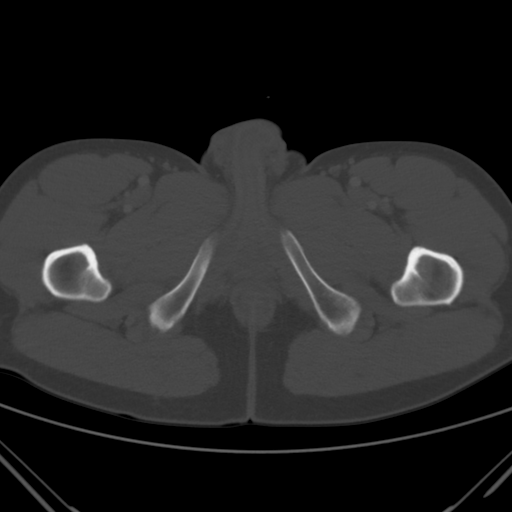
[im 7/48  soft-tissue]
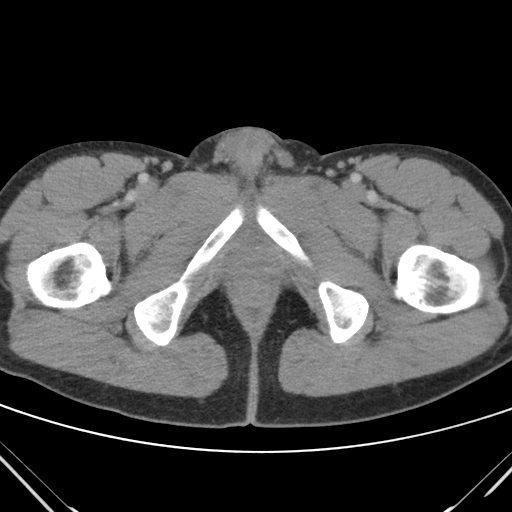
[im 10/48  soft-tissue]
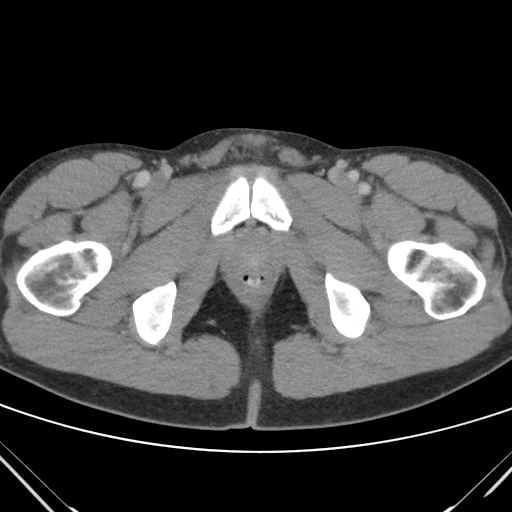
[im 14/48  soft-tissue]
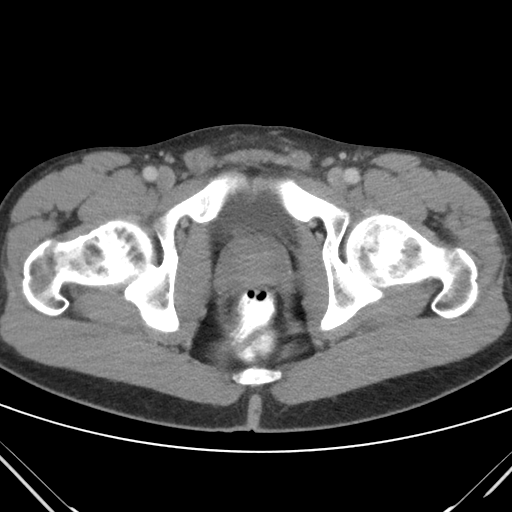
[im 17/48  soft-tissue]
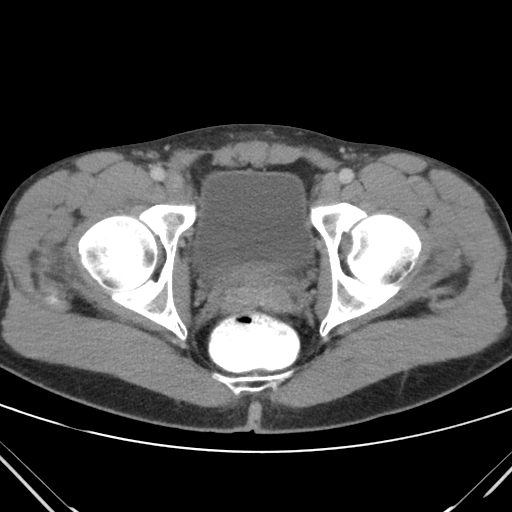
[im 20/48  soft-tissue]
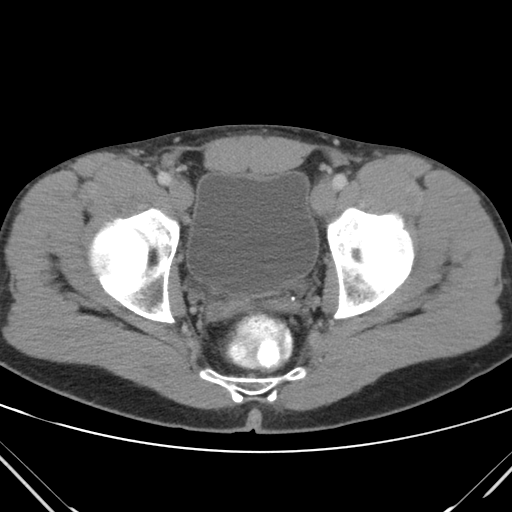
[im 25/48  soft-tissue]
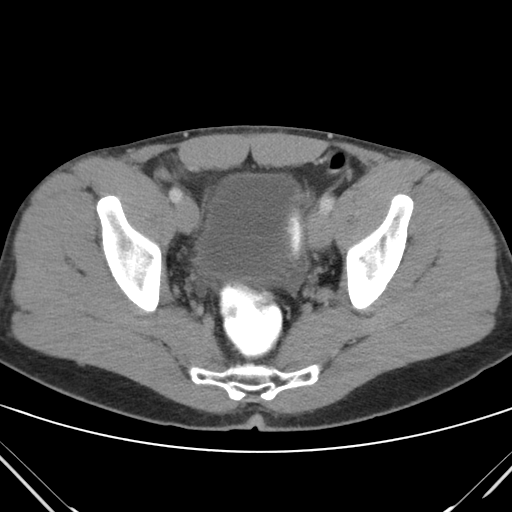
[im 28/48  soft-tissue]
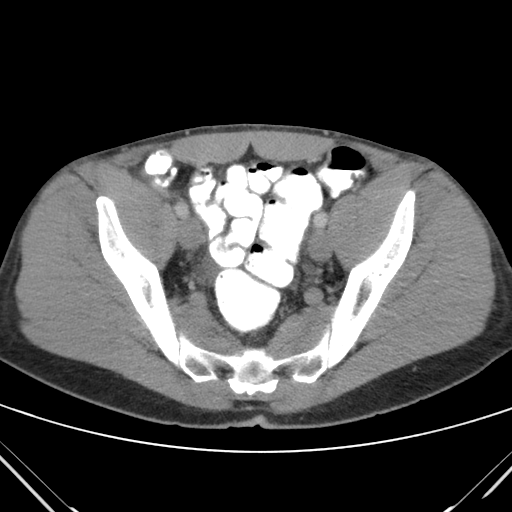
[im 31/48  soft-tissue]
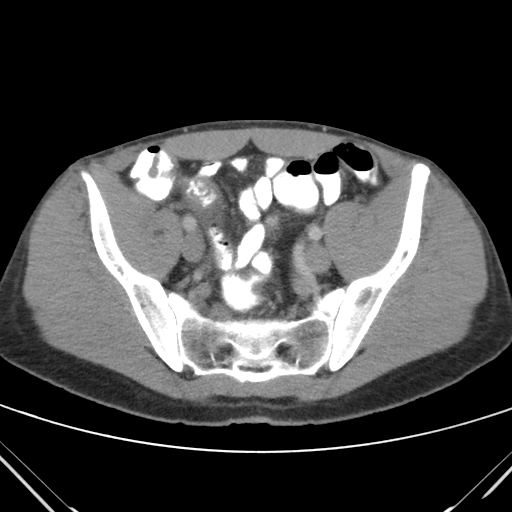
[im 31/48  bone]
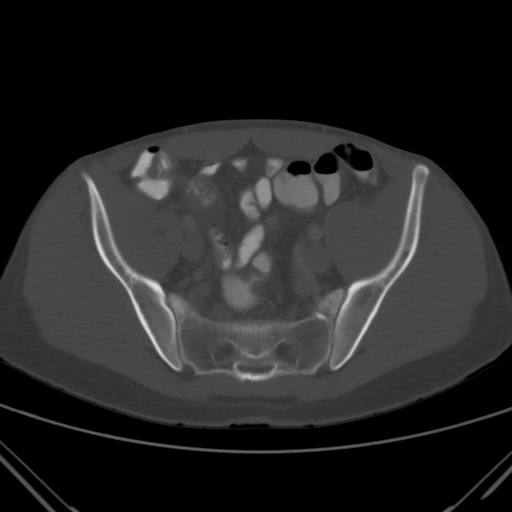
[im 34/48  soft-tissue]
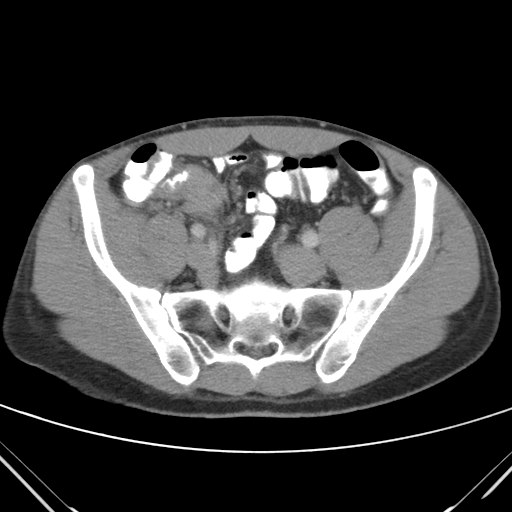
[im 38/48  soft-tissue]
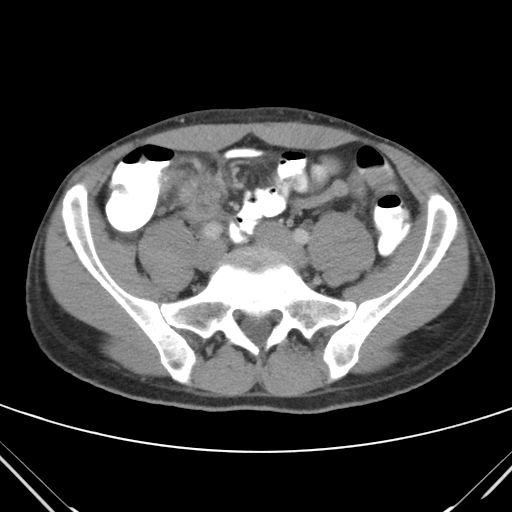
[im 41/48  soft-tissue]
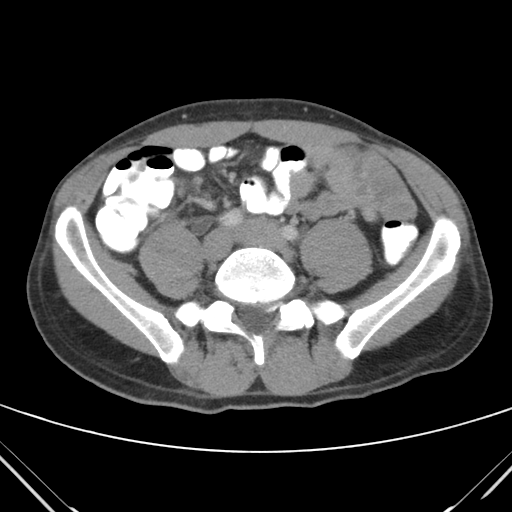
[im 44/48  soft-tissue]
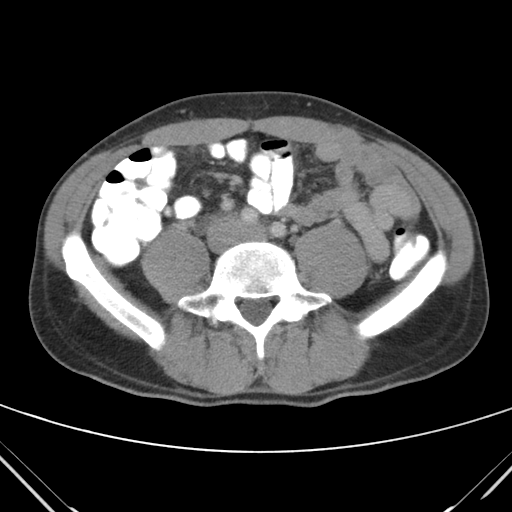

[Series 206: coronals, idose (2) · coronal · 0.45mm/px · 3 of 77 slices shown]
[im 26/77  soft-tissue]
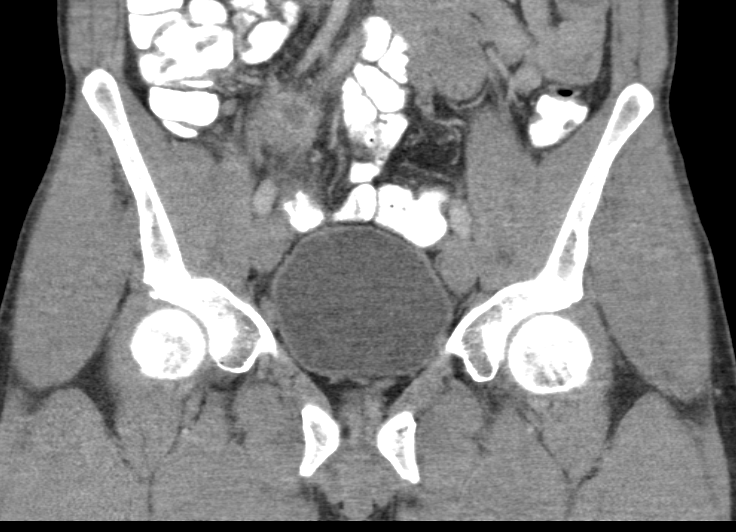
[im 34/77  soft-tissue]
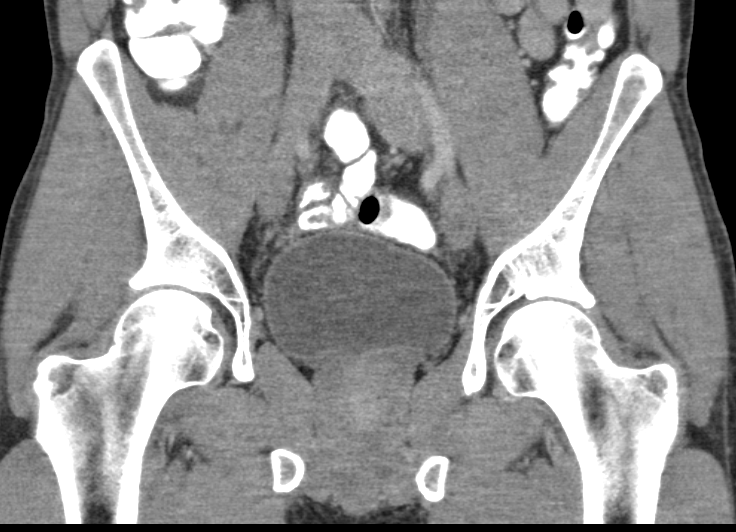
[im 43/77  soft-tissue]
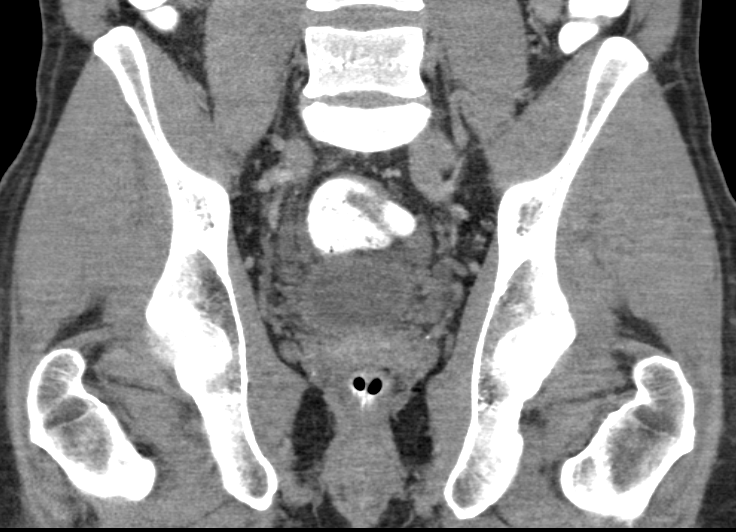

[16 of 46 positions shown; findings below may reference images not displayed]

FINDINGS: Abscess collection in RIGHT pelvis adjacent to terminal ileum
compatible with Crohn's disease.

Collection demonstrates slightly increased thickening of the wall
with decreased fluid component and decreased overall size, now
measuring 3.2 x 3.1 cm, previously 3.9 x 3.5 cm.

Persistent thickening of terminal ileal wall with scattered reactive
lymph nodes.

Minimal free pelvic fluid.

No new pelvic abscess collections identified.

Remaining pelvic bowel loops unremarkable.

Normal appendix.

Normal appearing bladder and ureters.

No mass, hernia or acute bone lesion.
IMPRESSION: Slight decrease in size of abscess collection identified adjacent to
the terminal ileum.

Persistent wall thickening of the terminal ileum consistent with
enteritis, question Crohns disease.

Minimal free pelvic fluid and scattered reactive lymph nodes noted.

## 2016-03-30 DIAGNOSIS — S46211A Strain of muscle, fascia and tendon of other parts of biceps, right arm, initial encounter: Secondary | ICD-10-CM | POA: Diagnosis not present

## 2016-04-08 DIAGNOSIS — S46211D Strain of muscle, fascia and tendon of other parts of biceps, right arm, subsequent encounter: Secondary | ICD-10-CM | POA: Diagnosis not present

## 2016-09-08 ENCOUNTER — Ambulatory Visit (INDEPENDENT_AMBULATORY_CARE_PROVIDER_SITE_OTHER): Payer: BLUE CROSS/BLUE SHIELD | Admitting: Gastroenterology

## 2016-09-08 ENCOUNTER — Encounter: Payer: Self-pay | Admitting: Gastroenterology

## 2016-09-08 ENCOUNTER — Encounter (INDEPENDENT_AMBULATORY_CARE_PROVIDER_SITE_OTHER): Payer: Self-pay

## 2016-09-08 VITALS — BP 92/52 | HR 70 | Ht 73.0 in | Wt 172.6 lb

## 2016-09-08 DIAGNOSIS — Z8719 Personal history of other diseases of the digestive system: Secondary | ICD-10-CM

## 2016-09-08 DIAGNOSIS — R1031 Right lower quadrant pain: Secondary | ICD-10-CM | POA: Diagnosis not present

## 2016-09-08 MED ORDER — AMBULATORY NON FORMULARY MEDICATION: Status: DC

## 2016-09-08 NOTE — Patient Instructions (Addendum)
We have given you samples of IBgard today.  Please take 1 to 2 capsules three times a day as needed.  Avoid excess fiber.   If you are age 40 or older, your body mass index should be between 23-30. Your Body mass index is 22.77 kg/m. If this is out of the aforementioned range listed, please consider follow up with your Primary Care Provider.  If you are age 40 or younger, your body mass index should be between 19-25. Your Body mass index is 22.77 kg/m. If this is out of the aformentioned range listed, please consider follow up with your Primary Care Provider.    Follow up as needed.

## 2016-09-08 NOTE — Progress Notes (Signed)
Nathaniel West    [id number]    Jul 04, 1975  Primary Care Physician:Nathaniel WestNathaniel West  Referring Physician: Clovis Riley, Elbert Ewings.Nathaniel West [address] [place], [place] [address]  Chief complaint:  RLQ pain  HPI: 57 yr M with history of right lower quadrant abscess thought to be secondary to complicated diverticulitis in 2015. Patient was treated with IV antibiotics. Subsequent colonoscopy with biopsies of terminal ileum did not show any evidence of Crohn's disease and he was noted to have isolated diverticula near ileocecal valve. Patient has been having intermittent episodes of right lower quadrant discomfort usually doesn't last more than a few minutes to a day. He presented to the ER in June 2016 and had CT abdomen and pelvis with contrast which showed no evidence of diverticulitis and was an unremarkable study. Labs were within normal limits and he was discharged home. Patient travels all over the world and he is worried if he would have an episode and he is traveling. Denies any fever, chills, nausea, vomiting, change in bowel habits or blood per rectum.   Outpatient Encounter Prescriptions as of 09/08/2016  Medication Sig  . loratadine (CLARITIN) 10 MG tablet Take 10 mg by mouth daily as needed for allergies.  . [DISCONTINUED] Multiple Vitamin (MULTIVITAMIN) tablet Take 1 tablet by mouth daily.   No facility-administered encounter medications on file as of 09/08/2016.     Allergies as of 09/08/2016  . (No Known Allergies)    Past Medical History:  Diagnosis Date  . Crohn disease (HCC)   . Intra-abdominal abscess (HCC)     History reviewed. No pertinent surgical history.  History reviewed. No pertinent family history.  Social History   Social History  . Marital status: Married    Spouse name: N/A  . Number of children: 2  . Years of education: N/A   Occupational History  . real estate    Social History Main Topics  . Smoking status: Former  Smoker    Types: Cigarettes    Quit date: 01/24/1998  . Smokeless tobacco: Never Used  . Alcohol use 3.0 oz/week    5 Cans of beer per week  . Drug use: No  . Sexual activity: Not on file   Other Topics Concern  . Not on file   Social History Narrative  . No narrative on file      Review of systems: Review of Systems  Constitutional: Negative for fever and chills.  HENT: Negative.   Eyes: Negative for blurred vision.  Respiratory: Negative for cough, shortness of breath and wheezing.   Cardiovascular: Negative for chest pain and palpitations.  Gastrointestinal: as per HPI Genitourinary: Negative for dysuria, urgency, frequency and hematuria.  Musculoskeletal: Negative for myalgias, back pain and joint pain.  Skin: Negative for itching and rash.  Neurological: Negative for dizziness, tremors, focal weakness, seizures and loss of consciousness.  Endo/Heme/Allergies: Negative for seasonal allergies.  Psychiatric/Behavioral: Negative for depression, suicidal ideas and hallucinations.  All other systems reviewed and are negative.   Physical Exam: Vitals:   09/08/16 0845  BP: (!) 92/52  Pulse: 70   Body mass index is 22.77 kg/m. Gen:      No acute distress HEENT:  EOMI, sclera anicteric Neck:     No masses; no thyromegaly Lungs:    Clear to auscultation bilaterally; normal respiratory effort CV:         Regular rate and rhythm; no murmurs Abd:      +  bowel sounds; soft, non-tender; no palpable masses, no distension Ext:    No edema; adequate peripheral perfusion Skin:      Warm and dry; no rash Neuro: alert and oriented x 3 Psych: normal mood and affect  Data Reviewed:  Reviewed labs, radiology imaging, old records and pertinent past GI work up   Assessment and Plan/Recommendations:  41 year old male with history of right lower quadrant abscess secondary to complicated diverticulitis 2015 here with complaints of intermittent right lower quadrant discomfort and  bloating I reassured patient and advised him to avoid excessive fiber His abdominal exam was benign and currently has no symptoms Thickening of sigmoid colon noted on CT scan June 2016 likely due to under distention of sigmoid colon. He does not have any evidence of IBD or Crohn's disease based on colonoscopy January 2016 Try peppermint oil capsule as needed for right lower quadrant abdominal discomfort and bloating, one capsule 3 times a day as needed. Return as needed  15 minutes was spent face-to-face with the patient. Greater than 50% of the time used for counseling as well as treatment plan and follow-up. He had multiple questions which were answered to his satisfaction  K. Nathaniel West , West [phone] Mon-Fri 8a-5p [phone] after 5p, weekends, holidays  CC: Nathaniel WestAugust Saucerean, West

## 2017-03-23 DIAGNOSIS — J3089 Other allergic rhinitis: Secondary | ICD-10-CM | POA: Diagnosis not present

## 2017-03-23 DIAGNOSIS — H6983 Other specified disorders of Eustachian tube, bilateral: Secondary | ICD-10-CM | POA: Diagnosis not present

## 2017-03-24 ENCOUNTER — Ambulatory Visit: Payer: Self-pay | Admitting: Podiatry

## 2017-03-31 ENCOUNTER — Ambulatory Visit (INDEPENDENT_AMBULATORY_CARE_PROVIDER_SITE_OTHER): Payer: BLUE CROSS/BLUE SHIELD | Admitting: Podiatry

## 2017-03-31 ENCOUNTER — Encounter: Payer: Self-pay | Admitting: Podiatry

## 2017-03-31 ENCOUNTER — Ambulatory Visit (INDEPENDENT_AMBULATORY_CARE_PROVIDER_SITE_OTHER): Payer: BLUE CROSS/BLUE SHIELD

## 2017-03-31 VITALS — BP 106/64 | HR 69 | Resp 16

## 2017-03-31 DIAGNOSIS — M79672 Pain in left foot: Secondary | ICD-10-CM

## 2017-03-31 DIAGNOSIS — M779 Enthesopathy, unspecified: Secondary | ICD-10-CM

## 2017-03-31 DIAGNOSIS — S99921S Unspecified injury of right foot, sequela: Secondary | ICD-10-CM

## 2017-03-31 DIAGNOSIS — M79671 Pain in right foot: Secondary | ICD-10-CM

## 2017-03-31 MED ORDER — METHYLPREDNISOLONE 4 MG PO TBPK: ORAL_TABLET | ORAL | 0 refills | Status: DC

## 2017-03-31 MED ORDER — MELOXICAM 15 MG PO TABS: 15.0000 mg | ORAL_TABLET | Freq: Every day | ORAL | 0 refills | Status: DC

## 2017-03-31 NOTE — Patient Instructions (Signed)
Start with the medrol dose pack. Once complete you can star the meloxicam.  We will contact the insurance company about the custom inserts   If was nice to meet you today. If you have any questions or any further concerns, please feel fee to give me a call. You can call our office at [phone] or please feel fee to send me a message through MyChart.

## 2017-03-31 NOTE — Progress Notes (Signed)
Subjective:    Patient ID: Nathaniel West, male    DOB: 12/26/1975, 42 y.o.   MRN: [id number]  HPI Nathaniel MaduroRobert presents the office today for concerns of pain to the right side worse than left.  He gets pain to the base of the second toe on the right foot.  He states that the right second toe is longer than the other toes as well as the left side but the right side is mostly chronic.  He states that he has some pain when he first gets up or after exercise.  He states the pain is intermittent but is on a daily basis.  He has had no significant treatment to the area.  This is been ongoing for about 7-10 years but is been gradually getting worse over time.  He denies any recent injury or trauma.  No numbness or tingling to the toes.  He also gets some occasional calluses to his feet.  He has no other concerns today.   Review of Systems  All other systems reviewed and are negative.  Past Medical History:  Diagnosis Date  . Crohn disease (HCC)   . Intra-abdominal abscess (HCC)     History reviewed. No pertinent surgical history.   Current Outpatient Medications:  .  AMBULATORY NON FORMULARY MEDICATION, Medication Name: IBgard take 1 to 2 capsules three times a day as needed., Disp: , Rfl:  .  fexofenadine-pseudoephedrine (ALLEGRA-D 24) 180-240 MG 24 hr tablet, Take 1 tablet by mouth as needed., Disp: , Rfl:  .  loratadine (CLARITIN) 10 MG tablet, Take 10 mg by mouth daily as needed for allergies., Disp: , Rfl:  .  mometasone (NASONEX) 50 MCG/ACT nasal spray, Place 2 sprays into the nose as needed. , Disp: , Rfl:  .  meloxicam (MOBIC) 15 MG tablet, Take 1 tablet (15 mg total) by mouth daily., Disp: 30 tablet, Rfl: 0 .  methylPREDNISolone (MEDROL DOSEPAK) 4 MG TBPK tablet, Take as directed, Disp: 21 tablet, Rfl: 0  No Known Allergies  Social History   Socioeconomic History  . Marital status: Married    Spouse name: Not on file  . Number of children: 2  . Years of education: Not on file  .  Highest education level: Not on file  Social Needs  . Financial resource strain: Not on file  . Food insecurity - worry: Not on file  . Food insecurity - inability: Not on file  . Transportation needs - medical: Not on file  . Transportation needs - non-medical: Not on file  Occupational History  . Occupation: real estate  Tobacco Use  . Smoking status: Former Smoker    Types: Cigarettes    Last attempt to quit: 01/24/1998    Years since quitting: 19.2  . Smokeless tobacco: Never Used  Substance and Sexual Activity  . Alcohol use: Yes    Alcohol/week: 3.0 oz    Types: 5 Cans of beer per week  . Drug use: No  . Sexual activity: Not on file  Other Topics Concern  . Not on file  Social History Narrative  . Not on file         Objective:   Physical Exam General: AAO x3, NAD  Dermatological: Minimal hyperkeratotic tissue present along the medial hallux on the right foot without any surrounding erythema, ascending cellulitis.  There is no ulceration or any signs of infection.  No other open lesions or pre-ulcerative lesion identified today.  Vascular: Dorsalis Pedis artery and Posterior  Tibial artery pedal pulses are 2/4 bilateral with immedate capillary fill time. Pedal hair growth present. No varicosities and no lower extremity edema present bilateral. There is no pain with calf compression, swelling, warmth, erythema.   Neruologic: Grossly intact via light touch bilateral. Protective threshold with Semmes Wienstein monofilament intact to all pedal sites bilateral.  Musculoskeletal: There is tenderness on the plantar aspect of the right second toe distal to the MPJ no more along the course of the plantar plate.  The toes and rectus position.  There is no gross instability of the second MPJ bilaterally.  There is no area pinpoint bony tenderness or pain to vibratory sensation.  Is no palpable neuroma identified today.  There is no other areas of tenderness identified.  Slight  decrease in medial arch height upon weightbearing on the right side worse than left.  Muscular strength 5/5 in all groups tested bilateral.  Gait: Unassisted, Nonantalgic.  42 year old male with right    Assessment & Plan:  Second capsulitis, possible plantar plate injury due to overuse -Treatment options discussed including all alternatives, risks, and complications -Etiology of symptoms were discussed -X-rays were obtained and reviewed with the patient.  There is no evidence of acute fracture or stress fracture identified today.  The second metatarsal is elongated compared to the other metatarsals -We discussed both conservative as well as surgical treatment options.  At this time we will start with conservative treatment.  A steroid Medrol Dosepak to help with some pain and follow this up with an anti-inflammatory, meloxicam.  Discussed side effects he did not take together.  Also discussed custom molded orthotics.  Check orthotic coverage.  He has purchased over-the-counter inserts with metatarsal support which does help.  Also discussed second metatarsal shortening osteotomy should symptoms continue.  Vivi Barrack DPM

## 2017-09-09 ENCOUNTER — Encounter (HOSPITAL_COMMUNITY): Payer: Self-pay | Admitting: Emergency Medicine

## 2017-09-09 ENCOUNTER — Emergency Department (HOSPITAL_COMMUNITY)
Admission: EM | Admit: 2017-09-09 | Discharge: 2017-09-09 | Disposition: A | Discharge status: Home / Self Care | Payer: BLUE CROSS/BLUE SHIELD | Attending: Emergency Medicine | Admitting: Emergency Medicine

## 2017-09-09 ENCOUNTER — Emergency Department (HOSPITAL_COMMUNITY): Payer: BLUE CROSS/BLUE SHIELD

## 2017-09-09 ENCOUNTER — Other Ambulatory Visit: Payer: Self-pay

## 2017-09-09 DIAGNOSIS — Y929 Unspecified place or not applicable: Secondary | ICD-10-CM | POA: Insufficient documentation

## 2017-09-09 DIAGNOSIS — M25511 Pain in right shoulder: Secondary | ICD-10-CM | POA: Diagnosis not present

## 2017-09-09 DIAGNOSIS — S46911A Strain of unspecified muscle, fascia and tendon at shoulder and upper arm level, right arm, initial encounter: Secondary | ICD-10-CM | POA: Insufficient documentation

## 2017-09-09 DIAGNOSIS — Y9355 Activity, bike riding: Secondary | ICD-10-CM | POA: Diagnosis not present

## 2017-09-09 DIAGNOSIS — S20211A Contusion of right front wall of thorax, initial encounter: Secondary | ICD-10-CM | POA: Diagnosis not present

## 2017-09-09 DIAGNOSIS — Z79899 Other long term (current) drug therapy: Secondary | ICD-10-CM | POA: Diagnosis not present

## 2017-09-09 DIAGNOSIS — S4991XA Unspecified injury of right shoulder and upper arm, initial encounter: Secondary | ICD-10-CM | POA: Diagnosis not present

## 2017-09-09 DIAGNOSIS — R0781 Pleurodynia: Secondary | ICD-10-CM | POA: Diagnosis not present

## 2017-09-09 DIAGNOSIS — S299XXA Unspecified injury of thorax, initial encounter: Secondary | ICD-10-CM | POA: Diagnosis not present

## 2017-09-09 DIAGNOSIS — Y999 Unspecified external cause status: Secondary | ICD-10-CM | POA: Diagnosis not present

## 2017-09-09 DIAGNOSIS — Z87891 Personal history of nicotine dependence: Secondary | ICD-10-CM | POA: Diagnosis not present

## 2017-09-09 MED ORDER — IBUPROFEN 600 MG PO TABS: 600.0000 mg | ORAL_TABLET | Freq: Four times a day (QID) | ORAL | 0 refills | Status: DC
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
## 2017-09-09 NOTE — ED Notes (Signed)
Pt returned from imaging.

## 2017-09-09 NOTE — ED Notes (Signed)
Declined W/C at D/C and was escorted to lobby by RN. 

## 2017-09-09 NOTE — ED Notes (Signed)
Pt last name pronounced like "coke." Prefers to sit in chair beside bed.

## 2017-09-09 NOTE — Discharge Instructions (Signed)
We saw you in the ER for shoulder pain and rib pain. The x-ray of your ribs and shoulder did not show any fracture.  We suspect that you have contusion of your chest.  There might be some bruising that might come about over the next few hours.  Ensure that you get proper and deep respirations in, so that you do not develop a lung infection.  Additionally, we suspect that you might have had some soft tissue injury to your shoulder.  Ice the shoulder for 10 minutes at least 3-4 times a day, and take anti-inflammatory medications.  Perform simple range of motion exercise for now and see the orthopedic surgeons in 1 to 2 weeks for further evaluation.

## 2017-09-26 ENCOUNTER — Ambulatory Visit (INDEPENDENT_AMBULATORY_CARE_PROVIDER_SITE_OTHER): Payer: BLUE CROSS/BLUE SHIELD | Admitting: Orthopaedic Surgery

## 2017-09-26 ENCOUNTER — Encounter (INDEPENDENT_AMBULATORY_CARE_PROVIDER_SITE_OTHER): Payer: Self-pay | Admitting: Orthopaedic Surgery

## 2017-09-26 DIAGNOSIS — M25511 Pain in right shoulder: Secondary | ICD-10-CM | POA: Diagnosis not present
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
## 2017-10-03 DIAGNOSIS — H52221 Regular astigmatism, right eye: Secondary | ICD-10-CM | POA: Diagnosis not present

## 2017-10-03 DIAGNOSIS — H5212 Myopia, left eye: Secondary | ICD-10-CM | POA: Diagnosis not present

## 2017-10-03 DIAGNOSIS — H5211 Myopia, right eye: Secondary | ICD-10-CM | POA: Diagnosis not present

## 2017-10-27 DIAGNOSIS — M25511 Pain in right shoulder: Secondary | ICD-10-CM | POA: Diagnosis not present

## 2017-11-03 DIAGNOSIS — M25511 Pain in right shoulder: Secondary | ICD-10-CM | POA: Diagnosis not present

## 2017-11-17 DIAGNOSIS — M25511 Pain in right shoulder: Secondary | ICD-10-CM | POA: Diagnosis not present

## 2017-12-01 DIAGNOSIS — M25511 Pain in right shoulder: Secondary | ICD-10-CM | POA: Diagnosis not present

## 2017-12-07 DIAGNOSIS — M25511 Pain in right shoulder: Secondary | ICD-10-CM | POA: Diagnosis not present

## 2018-01-16 ENCOUNTER — Other Ambulatory Visit: Payer: Self-pay | Admitting: Physician Assistant

## 2018-01-16 DIAGNOSIS — M25532 Pain in left wrist: Secondary | ICD-10-CM | POA: Diagnosis not present

## 2018-01-16 DIAGNOSIS — S63592A Other specified sprain of left wrist, initial encounter: Secondary | ICD-10-CM | POA: Diagnosis not present

## 2018-01-22 ENCOUNTER — Ambulatory Visit
Admission: RE | Admit: 2018-01-22 | Discharge: 2018-01-22 | Disposition: A | Discharge status: Home / Self Care | Payer: BLUE CROSS/BLUE SHIELD | Source: Ambulatory Visit | Attending: Physician Assistant | Admitting: Physician Assistant

## 2018-01-22 DIAGNOSIS — S63592A Other specified sprain of left wrist, initial encounter: Secondary | ICD-10-CM | POA: Diagnosis not present

## 2018-01-22 DIAGNOSIS — M25532 Pain in left wrist: Secondary | ICD-10-CM

## 2018-02-06 DIAGNOSIS — S63512D Sprain of carpal joint of left wrist, subsequent encounter: Secondary | ICD-10-CM | POA: Diagnosis not present

## 2018-02-23 DIAGNOSIS — R002 Palpitations: Secondary | ICD-10-CM | POA: Diagnosis not present

## 2018-07-03 DIAGNOSIS — L821 Other seborrheic keratosis: Secondary | ICD-10-CM | POA: Diagnosis not present

## 2018-07-03 DIAGNOSIS — D225 Melanocytic nevi of trunk: Secondary | ICD-10-CM | POA: Diagnosis not present

## 2018-07-03 DIAGNOSIS — B360 Pityriasis versicolor: Secondary | ICD-10-CM | POA: Diagnosis not present

## 2018-07-03 DIAGNOSIS — B353 Tinea pedis: Secondary | ICD-10-CM | POA: Diagnosis not present

## 2018-07-03 DIAGNOSIS — L570 Actinic keratosis: Secondary | ICD-10-CM | POA: Diagnosis not present

## 2018-11-05 DIAGNOSIS — L820 Inflamed seborrheic keratosis: Secondary | ICD-10-CM | POA: Diagnosis not present

## 2018-11-14 DIAGNOSIS — H0288B Meibomian gland dysfunction left eye, upper and lower eyelids: Secondary | ICD-10-CM | POA: Diagnosis not present

## 2018-11-14 DIAGNOSIS — H0288A Meibomian gland dysfunction right eye, upper and lower eyelids: Secondary | ICD-10-CM | POA: Diagnosis not present

## 2019-12-17 ENCOUNTER — Ambulatory Visit: Payer: Self-pay | Attending: Internal Medicine

## 2019-12-17 DIAGNOSIS — Z23 Encounter for immunization: Secondary | ICD-10-CM

## 2019-12-17 NOTE — Progress Notes (Signed)
   Covid-19 Vaccination Clinic  Name:  Nathaniel West    MRN: [id number] DOB: Jun 20, 1975  12/17/2019  Nathaniel West was observed post Covid-19 immunization for 15 minutes without incident. He was provided with Vaccine Information Sheet and instruction to access the V-Safe system.   Nathaniel West was instructed to call 911 with any severe reactions post vaccine: Marland Kitchen Difficulty breathing  . Swelling of face and throat  . A fast heartbeat  . A bad rash all over body  . Dizziness and weakness   Immunizations Administered    No immunizations on file.

## 2020-02-10 ENCOUNTER — Telehealth: Payer: Self-pay | Admitting: Internal Medicine
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
## 2020-11-30 ENCOUNTER — Ambulatory Visit (INDEPENDENT_AMBULATORY_CARE_PROVIDER_SITE_OTHER): Payer: 59

## 2020-11-30 ENCOUNTER — Encounter (HOSPITAL_BASED_OUTPATIENT_CLINIC_OR_DEPARTMENT_OTHER): Payer: Self-pay | Admitting: Cardiovascular Disease

## 2020-11-30 ENCOUNTER — Other Ambulatory Visit: Payer: Self-pay

## 2020-11-30 ENCOUNTER — Ambulatory Visit (INDEPENDENT_AMBULATORY_CARE_PROVIDER_SITE_OTHER): Payer: 59 | Admitting: Cardiovascular Disease

## 2020-11-30 VITALS — BP 110/74 | HR 70 | Ht 73.0 in | Wt 178.9 lb

## 2020-11-30 DIAGNOSIS — R002 Palpitations: Secondary | ICD-10-CM

## 2020-11-30 DIAGNOSIS — E875 Hyperkalemia: Secondary | ICD-10-CM

## 2020-11-30 HISTORY — DX: Palpitations: R00.2

## 2020-11-30 NOTE — Assessment & Plan Note (Signed)
On exam he feels like he is having frequent PVCs or PACs.  He does not have any heart failure symptoms.  On labs he did have hyperkalemia.  We will repeat his basic metabolic panel today.  Labs were otherwise unremarkable.  I did note that he takes Allegra-D.  Recommended that he switch to Allegra without the pseudoephedrine.  We will get a 3-day ZIO monitor to quantify his PVC burden.  Given his low blood pressure and heart rate, unless he has significant PVC burden, would recommend not starting any daily medications.  We could consider metoprolol as needed.  He would also like to avoid daily medicines if possible.

## 2020-11-30 NOTE — Patient Instructions (Addendum)
Medication Instructions:  Your physician recommends that you continue on your current medications as directed. Please refer to the Current Medication list given to you today.   *If you need a refill on your cardiac medications before your next appointment, please call your pharmacy*  Lab Work: BMET TODAY   If you have labs (blood work) drawn today and your tests are completely normal, you will receive your results only by: MyChart Message (if you have MyChart) OR A paper copy in the mail If you have any lab test that is abnormal or we need to change your treatment, we will call you to review the results.  Testing/Procedures: 3 DAY ZIO GIVEN TO YOU TODAY, PLACE WITHIN THE NEXT FEW DAYS   Follow-Up: At [facility], you and your health needs are our priority.  As part of our continuing mission to provide you with exceptional heart care, we have created designated Provider Care Teams.  These Care Teams include your primary Cardiologist (physician) and Advanced Practice Providers (APPs -  Physician Assistants and Nurse Practitioners) who all work together to provide you with the care you need, when you need it.  We recommend signing up for the patient portal called "MyChart".  Sign up information is provided on this After Visit Summary.  MyChart is used to connect with patients for Virtual Visits (Telemedicine).  Patients are able to view lab/test results, encounter notes, upcoming appointments, etc.  Non-urgent messages can be sent to your provider as well.   To learn more about what you can do with MyChart, go to [email or web address].    Your next appointment:   2 month(s)  The format for your next appointment:   In Person  Provider:   Chilton Si, MD   Christena Deem- Long Term Monitor Instructions  Your physician has requested you wear a ZIO patch monitor for 3 days.  This is a single patch monitor. [employer] supplies one patch monitor per enrollment. Additional stickers are not  available. Please do not apply patch if you will be having a Nuclear Stress Test,  Echocardiogram, Cardiac CT, MRI, or Chest Xray during the period you would be wearing the  monitor. The patch cannot be worn during these tests. You cannot remove and re-apply the  ZIO XT patch monitor.  Your ZIO patch monitor will be mailed 3 day USPS to your address on file. It may take 3-5 days  to receive your monitor after you have been enrolled.  Once you have received your monitor, please review the enclosed instructions. Your monitor  has already been registered assigning a specific monitor serial # to you.  Billing and Patient Assistance Program Information  We have supplied [employer] with any of your insurance information on file for billing purposes. [employer] offers a sliding scale Patient Assistance Program for patients that do not have  insurance, or whose insurance does not completely cover the cost of the ZIO monitor.  You must apply for the Patient Assistance Program to qualify for this discounted rate.  To apply, please call [employer] at [phone], select option 4, select option 2, ask to apply for  Patient Assistance Program. Meredeth Ide will ask your household income, and how many people  are in your household. They will quote your out-of-pocket cost based on that information.  [employer] will also be able to set up a 81-month, interest-free payment plan if needed.  Applying the monitor   Shave hair from upper left chest.  Hold abrader disc by orange tab. Rub abrader  in 40 strokes over the upper left chest as  indicated in your monitor instructions.  Clean area with 4 enclosed alcohol pads. Let dry.  Apply patch as indicated in monitor instructions. Patch will be placed under collarbone on left  side of chest with arrow pointing upward.  Rub patch adhesive wings for 2 minutes. Remove white label marked "1". Remove the white  label marked "2". Rub patch adhesive wings for 2 additional minutes.   While looking in a mirror, press and release button in center of patch. A small green light will  flash 3-4 times. This will be your only indicator that the monitor has been turned on.  Do not shower for the first 24 hours. You may shower after the first 24 hours.  Press the button if you feel a symptom. You will hear a small click. Record Date, Time and  Symptom in the Patient Logbook.  When you are ready to remove the patch, follow instructions on the last 2 pages of Patient  Logbook. Stick patch monitor onto the last page of Patient Logbook.  Place Patient Logbook in the blue and white box. Use locking tab on box and tape box closed  securely. The blue and white box has prepaid postage on it. Please place it in the mailbox as  soon as possible. Your physician should have your test results approximately 7 days after the  monitor has been mailed back to [facility].  Call [facility] Customer Care at [phone] if you have questions regarding  your ZIO XT patch monitor. Call them immediately if you see an orange light blinking on your  monitor.  If your monitor falls off in less than 4 days, contact our Monitor department at [phone].  If your monitor becomes loose or falls off after 4 days call [employer] at [phone] for  suggestions on securing your monitor

## 2020-11-30 NOTE — Progress Notes (Addendum)
Cardiology Office Note   Date:  01/01/2023   ID:  Nathaniel West, DOB 12-11-1975, MRN [id number]  PCP:  Irven Coe, MD  Cardiologist:   Chilton Si, MD   No chief complaint on file.      History of Present Illness: Nathaniel West is a 45 y.o. male who is being seen today for the evaluation of palpitations at the request of Irven Coe, MD. he notes that for the last several years he has had intermittent episodes of palpitations.  It seems to occur sporadically.  Sometimes the episodes occur months apart.  Then later it happens daily for weeks at a time.  When it happens he feels an irregularity and then a surge with a harder heartbeat following.  He has no associated chest pain but sometimes does get lightheaded.  He sometimes can feel the blood rushing in the back of his head and it can cause headaches.  He sometimes notices it when falling asleep, but it does not prevent him from going to sleep.  He exercises regularly and mountain bikes for a total of 2 to 3 hours each week.  He also rides the [employer] bike and does yoga.  He has no exertional symptoms and does not typically notice it when he is exercising.  He typically drinks 2 coffees in the mornings and this is unchanged from his baseline.  He does not have other caffeine throughout the day.  The symptoms do not seem to be clearly associated with stress.  He did lose his job last week, but the symptoms existed prior to that stressor.  He has not had any lower extremity edema, orthopnea, or PND.  He had lab work with his PCP that revealed a potassium of 5.8 but thyroid function, blood counts, and all other electrolytes were unremarkable.  He had several EKGs with his PCP.  Of note 2 of them showed sinus bradycardia in the 40s.  No PVCs or PACs were captured on their EKGs.   Past Medical History:  Diagnosis Date   Intra-abdominal abscess (HCC)    Palpitations 11/30/2020    History reviewed. No pertinent surgical  history.   Current Outpatient Medications  Medication Sig Dispense Refill   fexofenadine (ALLEGRA) 180 MG tablet Take 180 mg by mouth daily.     Multiple Vitamin (MULTIVITAMIN PO) Take 1 tablet by mouth daily.     No current facility-administered medications for this visit.    Allergies:   Patient has no known allergies.    Social History:  The patient  reports that he quit smoking about 24 years ago. His smoking use included cigarettes. He has never used smokeless tobacco. He reports current alcohol use of about 5.0 standard drinks of alcohol per week. He reports that he does not use drugs.   Family History:  The patient's family history includes Coronary artery disease in his father; Heart failure (age of onset: 40) in his paternal grandfather.    ROS:  Please see the history of present illness.   Otherwise, review of systems are positive for none.   All other systems are reviewed and negative.    PHYSICAL EXAM: VS:  BP 110/74 (BP Location: Right Arm, Patient Position: Sitting, Cuff Size: Normal)   Pulse 70   Ht 6\' 1"  (1.854 m)   Wt 178 lb 14.4 oz (81.1 kg)   BMI 23.60 kg/m  , BMI Body mass index is 23.6 kg/m. GENERAL:  Well appearing HEENT:  Pupils equal  round and reactive, fundi not visualized, oral mucosa unremarkable NECK:  No jugular venous distention, waveform within normal limits, carotid upstroke brisk and symmetric, no bruits, no thyromegaly LUNGS:  Clear to auscultation bilaterally HEART: Mostly regular with frequent ectopy.  PMI not displaced or sustained,S1 and S2 within normal limits, no S3, no S4, no clicks, no rubs, no murmurs ABD:  Flat, positive bowel sounds normal in frequency in pitch, no bruits, no rebound, no guarding, no midline pulsatile mass, no hepatomegaly, no splenomegaly EXT:  2 plus pulses throughout, no edema, no cyanosis no clubbing SKIN:  No rashes no nodules NEURO:  Cranial nerves II through XII grossly intact, motor grossly intact  throughout PSYCH:  Cognitively intact, oriented to person place and time    EKG:  EKG is ordered today. The ekg ordered today demonstrates sinus rhythm.     Recent Labs: No results found for requested labs within last 365 days.    Lipid Panel No results found for: "CHOL", "TRIG", "HDL", "CHOLHDL", "VLDL", "LDLCALC", "LDLDIRECT"    Wt Readings from Last 3 Encounters:  12/16/22 184 lb (83.5 kg)  02/22/21 183 lb 12.8 oz (83.4 kg)  11/30/20 178 lb 14.4 oz (81.1 kg)      ASSESSMENT AND PLAN:  Palpitations On exam he feels like he is having frequent PVCs or PACs.  He does not have any heart failure symptoms.  On labs he did have hyperkalemia.  We will repeat his basic metabolic panel today.  Labs were otherwise unremarkable.  I did note that he takes Allegra-D.  Recommended that he switch to Allegra without the pseudoephedrine.  We will get a 3-day ZIO monitor to quantify his PVC burden.  Given his low blood pressure and heart rate, unless he has significant PVC burden, would recommend not starting any daily medications.  We could consider metoprolol as needed.  He would also like to avoid daily medicines if possible.   Current medicines are reviewed at length with the patient today.  The patient has concerns regarding medicines.  The following changes have been made:  no change  Labs/ tests ordered today include:   Orders Placed This Encounter  Procedures   Basic metabolic panel   LONG TERM MONITOR (3-14 DAYS)   EKG 12-Lead     Disposition:   FU with Angelito Hopping C. Duke Salvia, MD, [facility] in 2 months.    Signed, Elonna Mcfarlane C. Duke Salvia, MD, [facility]  01/01/2023 11:27 AM    [facility]

## 2020-12-01 ENCOUNTER — Encounter (HOSPITAL_BASED_OUTPATIENT_CLINIC_OR_DEPARTMENT_OTHER): Payer: Self-pay

## 2020-12-01 DIAGNOSIS — R002 Palpitations: Secondary | ICD-10-CM | POA: Diagnosis not present

## 2020-12-01 LAB — BASIC METABOLIC PANEL
BUN/Creatinine Ratio: 18 (ref 9–20)
BUN: 21 mg/dL (ref 6–24)
CO2: 29 mmol/L (ref 20–29)
Calcium: 9.7 mg/dL (ref 8.7–10.2)
Chloride: 105 mmol/L (ref 96–106)
Creatinine, Ser: 1.18 mg/dL (ref 0.76–1.27)
Glucose: 95 mg/dL (ref 70–99)
Potassium: 4.5 mmol/L (ref 3.5–5.2)
Sodium: 148 mmol/L — ABNORMAL HIGH (ref 134–144)
eGFR: 78 mL/min/{1.73_m2} (ref >59)

## 2021-01-04 ENCOUNTER — Other Ambulatory Visit (HOSPITAL_BASED_OUTPATIENT_CLINIC_OR_DEPARTMENT_OTHER): Payer: Self-pay | Admitting: *Deleted

## 2021-01-04 DIAGNOSIS — I493 Ventricular premature depolarization: Secondary | ICD-10-CM

## 2021-02-05 ENCOUNTER — Ambulatory Visit (HOSPITAL_BASED_OUTPATIENT_CLINIC_OR_DEPARTMENT_OTHER): Payer: 59 | Admitting: Cardiovascular Disease

## 2021-02-22 ENCOUNTER — Encounter: Payer: Self-pay | Admitting: Cardiology

## 2021-02-22 ENCOUNTER — Ambulatory Visit (INDEPENDENT_AMBULATORY_CARE_PROVIDER_SITE_OTHER): Payer: 59 | Admitting: Cardiology

## 2021-02-22 ENCOUNTER — Other Ambulatory Visit: Payer: Self-pay

## 2021-02-22 VITALS — BP 124/80 | HR 58 | Ht 73.0 in | Wt 183.8 lb

## 2021-02-22 DIAGNOSIS — I493 Ventricular premature depolarization: Secondary | ICD-10-CM | POA: Diagnosis not present

## 2021-02-22 NOTE — Progress Notes (Signed)
Electrophysiology Office Note   Date:  02/22/2021   ID:  Nathaniel West, DOB [birth year], MRN [id number]  PCP:  Nathaniel Graff.Marlou Sa, MD  Cardiologist: Nathaniel West Primary Electrophysiologist:  Nathaniel Lisowski Meredith Leeds, MD    Chief Complaint: PVCs   History of Present Illness: Nathaniel West is a 46 y.o. male who is being seen today for the evaluation of PVCs at the request of Nathaniel Latch, MD. Presenting today for electrophysiology evaluation.  He has a history significant for Crohn's disease and PVCs.  His PVCs occur intermittently.  He has palpitations with his PVCs.  He states that he can go many months without having PVCs, but then has multiple days in a row feeling symptoms.  He has not noted any specific triggers such as caffeine or alcohol.  He also notes PVCs when he is laying down to go to sleep at night.  He is quite active, biking quite a bit and riding his [employer].  Today, he denies symptoms of palpitations, chest pain, shortness of breath, orthopnea, PND, lower extremity edema, claudication, dizziness, presyncope, syncope, bleeding, or neurologic sequela. The patient is tolerating medications without difficulties.    Past Medical History:  Diagnosis Date   Crohn disease ([place])    Intra-abdominal abscess ([place])    Palpitations 11/30/2020   History reviewed. No pertinent surgical history.   Current Outpatient Medications  Medication Sig Dispense Refill   fexofenadine (ALLEGRA) 180 MG tablet Take 180 mg by mouth daily.     Multiple Vitamin (MULTIVITAMIN PO) Take 1 tablet by mouth daily.     No current facility-administered medications for this visit.    Allergies:   Patient has no known allergies.   Social History:  The patient  reports that he quit smoking about 23 years ago. His smoking use included cigarettes. He has never used smokeless tobacco. He reports current alcohol use of about 5.0 standard drinks per week. He reports that he does not use drugs.   Family History:   The patient's family history includes Coronary artery disease in his father; Heart failure (age of onset: 32) in his paternal grandfather.    ROS:  Please see the history of present illness.   Otherwise, review of systems is positive for none.   All other systems are reviewed and negative.    PHYSICAL EXAM: VS:  BP 124/80    Pulse (!) 58    Ht 6\' 1"  (1.854 m)    Wt 183 lb 12.8 oz (83.4 kg)    SpO2 98%    BMI 24.25 kg/m  , BMI Body mass index is 24.25 kg/m. GEN: Well nourished, well developed, in no acute distress  HEENT: normal  Neck: no JVD, carotid bruits, or masses Cardiac: RRR; no murmurs, rubs, or gallops,no edema  Respiratory:  clear to auscultation bilaterally, normal work of breathing GI: soft, nontender, nondistended, + BS MS: no deformity or atrophy  Skin: warm and dry Neuro:  Strength and sensation are intact Psych: euthymic mood, full affect  EKG:  EKG is ordered today. Personal review of the ekg ordered shows sinus rhythm  Recent Labs: 11/30/2020: BUN 21; Creatinine, Ser 1.18; Potassium 4.5; Sodium 148    Lipid Panel  No results found for: CHOL, TRIG, HDL, CHOLHDL, VLDL, LDLCALC, LDLDIRECT   Wt Readings from Last 3 Encounters:  02/22/21 183 lb 12.8 oz (83.4 kg)  11/30/20 178 lb 14.4 oz (81.1 kg)  09/08/16 172 lb 9.6 oz (78.3 kg)      Other studies  Reviewed: Additional studies/ records that were reviewed today include: Cardiac monitor 12/23/2020 personally reviewed Review of the above records today demonstrates:  12.5% PVCs Rare ventricular couplets and triplets. Ventricular bigeminy noted   ASSESSMENT AND PLAN:  1.  PVCs: Elevated burden at 12.5%.  He feels palpitations associated with the PVCs.  Despite his elevated burden, he continues to be able to do all of his daily activities.  He is okay with not aggressively treating his PVCs assuming that his ejection fraction remains normal.  We Nathaniel West check an echo today.    Current medicines are reviewed at  length with the patient today.   The patient does not have concerns regarding his medicines.  The following changes were made today:  none  Labs/ tests ordered today include:  Orders Placed This Encounter  Procedures   EKG 12-Lead   ECHOCARDIOGRAM COMPLETE     Disposition:   FU with Nathaniel West 6 months  Signed, Nathaniel Ayo Meredith Leeds, MD  02/22/2021 11:51 AM     [address] [place] [address] [phone] (office) [phone] (fax)

## 2021-02-22 NOTE — Patient Instructions (Signed)
Medication Instructions:  Your physician recommends that you continue on your current medications as directed. Please refer to the Current Medication list given to you today.  *If you need a refill on your cardiac medications before your next appointment, please call your pharmacy*   Lab Work: None ordered   Testing/Procedures: Your physician has requested that you have an echocardiogram. Echocardiography is a painless test that uses sound waves to create images of your heart. It provides your doctor with information about the size and shape of your heart and how well your hearts chambers and valves are working. This procedure takes approximately one hour. There are no restrictions for this procedure.    Follow-Up: At [facility], you and your health needs are our priority.  As part of our continuing mission to provide you with exceptional heart care, we have created designated Provider Care Teams.  These Care Teams include your primary Cardiologist (physician) and Advanced Practice Providers (APPs -  Physician Assistants and Nurse Practitioners) who all work together to provide you with the care you need, when you need it.   Your next appointment:   6 month(s)  The format for your next appointment:   In Person  Provider:   Loman Brooklyn, MD    Thank you for choosing [facility]!!   Dory Horn, RN [phone]

## 2021-03-05 ENCOUNTER — Other Ambulatory Visit: Payer: Self-pay

## 2021-03-05 ENCOUNTER — Ambulatory Visit (HOSPITAL_COMMUNITY): Payer: 59 | Attending: Cardiology

## 2021-03-05 DIAGNOSIS — I493 Ventricular premature depolarization: Secondary | ICD-10-CM | POA: Insufficient documentation

## 2021-03-05 LAB — ECHOCARDIOGRAM COMPLETE
Area-P 1/2: 3.61 cm2
S' Lateral: 3.3 cm

## 2021-04-13 ENCOUNTER — Telehealth: Payer: Self-pay

## 2021-04-13 DIAGNOSIS — I493 Ventricular premature depolarization: Secondary | ICD-10-CM

## 2021-04-13 NOTE — Telephone Encounter (Signed)
-----   Message from Will Jorja Loa, MD sent at 03/07/2021  2:26 PM EST ----- ?LVEF normal. Follow up 6 months with app and repeat echo prior. ? ?

## 2021-04-13 NOTE — Telephone Encounter (Signed)
Order placed for ECHO due August 2023.  Please schedule with APP after Echo is scheduled. ?

## 2021-07-30 DIAGNOSIS — R1012 Left upper quadrant pain: Secondary | ICD-10-CM | POA: Diagnosis not present

## 2021-07-30 DIAGNOSIS — K30 Functional dyspepsia: Secondary | ICD-10-CM | POA: Diagnosis not present

## 2021-08-02 ENCOUNTER — Telehealth (HOSPITAL_COMMUNITY): Payer: Self-pay | Admitting: Cardiology

## 2021-08-02 NOTE — Telephone Encounter (Signed)
Called patient to schedule echocardiogram per Dr Elberta Fortis fr September. Spoke with patient and he does not wish to have echo and states he spoke with PCP and they decided he didnt needs since he was not having any issues. Order will be removed from the echo WQ.

## 2021-08-03 ENCOUNTER — Other Ambulatory Visit: Payer: Self-pay | Admitting: Family Medicine

## 2021-08-03 DIAGNOSIS — R109 Unspecified abdominal pain: Secondary | ICD-10-CM

## 2021-08-16 DIAGNOSIS — R109 Unspecified abdominal pain: Secondary | ICD-10-CM | POA: Diagnosis not present

## 2022-01-09 DIAGNOSIS — Z6823 Body mass index (BMI) 23.0-23.9, adult: Secondary | ICD-10-CM | POA: Diagnosis not present

## 2022-01-09 DIAGNOSIS — Z23 Encounter for immunization: Secondary | ICD-10-CM | POA: Diagnosis not present

## 2022-01-09 DIAGNOSIS — J029 Acute pharyngitis, unspecified: Secondary | ICD-10-CM | POA: Diagnosis not present

## 2022-05-04 DIAGNOSIS — L239 Allergic contact dermatitis, unspecified cause: Secondary | ICD-10-CM | POA: Diagnosis not present

## 2022-05-04 DIAGNOSIS — B360 Pityriasis versicolor: Secondary | ICD-10-CM | POA: Diagnosis not present

## 2022-05-04 DIAGNOSIS — B353 Tinea pedis: Secondary | ICD-10-CM | POA: Diagnosis not present

## 2022-09-19 DIAGNOSIS — T63463A Toxic effect of venom of wasps, assault, initial encounter: Secondary | ICD-10-CM | POA: Diagnosis not present

## 2022-09-19 DIAGNOSIS — L03114 Cellulitis of left upper limb: Secondary | ICD-10-CM | POA: Diagnosis not present

## 2022-10-21 DIAGNOSIS — Z23 Encounter for immunization: Secondary | ICD-10-CM | POA: Diagnosis not present

## 2022-10-21 DIAGNOSIS — M542 Cervicalgia: Secondary | ICD-10-CM | POA: Diagnosis not present

## 2022-11-21 ENCOUNTER — Other Ambulatory Visit: Payer: Self-pay | Admitting: Surgery

## 2022-11-25 ENCOUNTER — Encounter: Payer: Self-pay | Admitting: Cardiology

## 2022-12-12 DIAGNOSIS — L03031 Cellulitis of right toe: Secondary | ICD-10-CM | POA: Diagnosis not present

## 2022-12-12 DIAGNOSIS — L538 Other specified erythematous conditions: Secondary | ICD-10-CM | POA: Diagnosis not present

## 2022-12-12 DIAGNOSIS — L814 Other melanin hyperpigmentation: Secondary | ICD-10-CM | POA: Diagnosis not present

## 2022-12-12 DIAGNOSIS — B351 Tinea unguium: Secondary | ICD-10-CM | POA: Diagnosis not present

## 2022-12-12 DIAGNOSIS — D492 Neoplasm of unspecified behavior of bone, soft tissue, and skin: Secondary | ICD-10-CM | POA: Diagnosis not present

## 2022-12-12 DIAGNOSIS — L821 Other seborrheic keratosis: Secondary | ICD-10-CM | POA: Diagnosis not present

## 2022-12-12 DIAGNOSIS — D225 Melanocytic nevi of trunk: Secondary | ICD-10-CM | POA: Diagnosis not present

## 2022-12-14 DIAGNOSIS — L03031 Cellulitis of right toe: Secondary | ICD-10-CM | POA: Diagnosis not present

## 2022-12-16 ENCOUNTER — Encounter: Payer: Self-pay | Admitting: Podiatry

## 2022-12-16 ENCOUNTER — Ambulatory Visit (INDEPENDENT_AMBULATORY_CARE_PROVIDER_SITE_OTHER): Payer: 59 | Admitting: Podiatry

## 2022-12-16 VITALS — Ht 73.0 in | Wt 184.0 lb

## 2022-12-16 DIAGNOSIS — L03031 Cellulitis of right toe: Secondary | ICD-10-CM

## 2022-12-16 DIAGNOSIS — L603 Nail dystrophy: Secondary | ICD-10-CM

## 2022-12-16 NOTE — Progress Notes (Signed)
  Subjective:  Patient ID: Nathaniel West, male    DOB: March 26, 1975,  MRN: [id number]  Chief Complaint  Patient presents with   Nail Problem    Patient is here for right hallux nail fungus    47 y.o. male presents with concern for fungal infection of the right hallux nail as well as prior paronychia.  Went to urgent care and was placed on antibiotics.  Also he was able to drain some of the pus from underneath the nail and since then it is cleared up.  Not having much pain at this time.  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  No obvious subungual purulence or infection around the nail.  Mild inflammation at the nail base no pain on palpation  Vascular:  Dorsalis Pedis artery and Posterior Tibial artery pedal pulses are 2/4 bilateral.  Capillary fill time < 3 sec to all digits.   Neruologic: Grossly intact via light touch bilateral. Protective threshold intact to all sites bilateral.   Musculoskeletal: No gross boney pedal deformities bilateral. No pain, crepitus, or limitation noted with foot and ankle range of motion bilateral. Muscular strength 5/5 in all groups tested bilateral.  Gait: Unassisted, Nonantalgic.   No images are attached to the encounter.   Assessment:   1. Paronychia of great toe of right foot   2. Onychodystrophy      Plan:  Patient was evaluated and treated and all questions answered.  # Paronychia of right great toe -Mild prior paronychia of the right hallux -Now improved status post antibiotics and manual decompression that was performed by the patient -At this time I offered nail avulsion to him as I explained this could come back and be a problem for him in the future -He will consider this.  He does have a backpacking trip this weekend  is does not want to have it done now. -If there is recurrence of the infection pain redness or drainage she should call to come back in to get nail avulsion on the right hallux.          Corinna Gab, DPM [facility] / [facility]

## 2023-01-05 ENCOUNTER — Ambulatory Visit (INDEPENDENT_AMBULATORY_CARE_PROVIDER_SITE_OTHER): Payer: 59 | Admitting: Podiatry

## 2023-01-05 ENCOUNTER — Encounter: Payer: Self-pay | Admitting: Podiatry

## 2023-01-05 DIAGNOSIS — L600 Ingrowing nail: Secondary | ICD-10-CM

## 2023-01-05 MED ORDER — CICLOPIROX 8 % EX SOLN: Freq: Every day | CUTANEOUS | 0 refills | Status: AC

## 2023-01-05 NOTE — Progress Notes (Signed)
  Subjective:  Patient ID: Nathaniel West, male    DOB: 12-Nov-1975,  MRN: [id number]  Chief Complaint  Patient presents with   Nail Problem    Patient will like right hallux removed     47 y.o. male presents for follow-up of fungal infection and prior paronychia of the right hallux nail.  He does have some dystrophy and lysis of the nail.  He would like to have the right hallux nail removed as we have previously discussed as an option due to the significant dystrophy and lysis of the nail.  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  No obvious subungual purulence or infection around the nail.  Mild inflammation at the nail base no pain on palpation there is significant lysis of the nail at this time  Vascular:  Dorsalis Pedis artery and Posterior Tibial artery pedal pulses are 2/4 bilateral.  Capillary fill time < 3 sec to all digits.   Neruologic: Grossly intact via light touch bilateral. Protective threshold intact to all sites bilateral.   Musculoskeletal: No gross boney pedal deformities bilateral. No pain, crepitus, or limitation noted with foot and ankle range of motion bilateral. Muscular strength 5/5 in all groups tested bilateral.  Gait: Unassisted, Nonantalgic.   No images are attached to the encounter.   Assessment:   1. Ingrown nail of great toe of right foot [L60.0]      Plan:  Patient was evaluated and treated and all questions answered.  # Paronychia of right great toe  # Nail lysis due to prior trauma and fungal dystrophy At this time, recommended partial nail removal without chemical matricectomy to the total right hallux. Risks and complications were discussed with the patient for which they understand and  verbally consent to the procedure. skin  was prepped in sterile fashion.  Next the total right hallux nail  was sharply excised making sure to remove the entire offending nail base. . Once the nail was  Removed, the area was debrided and the underlying skin was intact. The area was irrigated and hemostasis was obtained.  A dry sterile dressing was applied. After application of the dressing the tourniquet was removed and there is found to be an immediate capillary refill time to the digit. The patient tolerated the procedure well any complications. Post procedure instructions were discussed the patient for which he verbally understood. Follow-up in one week for nail check or sooner if any problems are to arise. Discussed signs/symptoms of worsening infection and directed to call the office immediately should any occur or go directly to the emergency room. In the meantime, encouraged to call the office with any questions, concerns, changes symptoms.          Corinna Gab, DPM [facility] / [facility]

## 2023-01-05 NOTE — Patient Instructions (Signed)

## 2023-02-23 NOTE — ED Provider Notes (Signed)
Addendum to ED note of 07/12/2017: During that visit a PMH of Crohn's Disease was listed in past medical history thought to originate in differential of CT report.  Subsequently imaging and GI evaluation demonstrated NO EVIDENCE OF Crohn's Disease. The diagnosis should be removed from the past medical history.     Margarita Grizzle, MD 02/23/23 [phone]

## 2023-09-18 DIAGNOSIS — H5213 Myopia, bilateral: Secondary | ICD-10-CM | POA: Diagnosis not present

## 2023-09-18 DIAGNOSIS — H04123 Dry eye syndrome of bilateral lacrimal glands: Secondary | ICD-10-CM | POA: Diagnosis not present

## 2023-10-15 DIAGNOSIS — J029 Acute pharyngitis, unspecified: Secondary | ICD-10-CM | POA: Diagnosis not present

## 2023-10-15 DIAGNOSIS — H6692 Otitis media, unspecified, left ear: Secondary | ICD-10-CM | POA: Diagnosis not present
